# Patient Record
Sex: Female | Born: 1940
Health system: Southern US, Community
[De-identification: ages and names within clinical notes are randomized; demographics above are authoritative.]

## PROBLEM LIST (undated history)

## (undated) DIAGNOSIS — I10 Essential (primary) hypertension: Secondary | ICD-10-CM

## (undated) DIAGNOSIS — J45909 Unspecified asthma, uncomplicated: Secondary | ICD-10-CM

## (undated) HISTORY — PX: CHOLECYSTECTOMY: SHX55

---

## 2003-08-26 ENCOUNTER — Other Ambulatory Visit: Payer: Self-pay

## 2005-05-12 ENCOUNTER — Ambulatory Visit: Payer: Self-pay | Admitting: Internal Medicine

## 2006-08-12 ENCOUNTER — Ambulatory Visit: Payer: Self-pay | Admitting: Internal Medicine

## 2007-10-04 ENCOUNTER — Ambulatory Visit: Payer: Self-pay | Admitting: Internal Medicine

## 2009-10-11 ENCOUNTER — Ambulatory Visit: Payer: Self-pay | Admitting: Internal Medicine

## 2010-12-03 ENCOUNTER — Ambulatory Visit: Payer: Self-pay | Admitting: Internal Medicine

## 2013-01-19 ENCOUNTER — Emergency Department: Payer: Self-pay | Admitting: Internal Medicine

## 2018-01-17 ENCOUNTER — Encounter: Payer: Self-pay | Admitting: *Deleted

## 2018-01-20 ENCOUNTER — Ambulatory Visit: Payer: Medicare HMO | Admitting: Certified Registered"

## 2018-01-20 ENCOUNTER — Ambulatory Visit
Admission: RE | Admit: 2018-01-20 | Discharge: 2018-01-20 | Disposition: A | Discharge status: Home / Self Care | Payer: Medicare HMO | Source: Ambulatory Visit | Attending: Ophthalmology | Admitting: Ophthalmology

## 2018-01-20 ENCOUNTER — Encounter: Payer: Self-pay | Admitting: *Deleted

## 2018-01-20 ENCOUNTER — Other Ambulatory Visit: Payer: Self-pay

## 2018-01-20 ENCOUNTER — Encounter
Admission: RE | Disposition: A | Discharge status: Home / Self Care | Payer: Self-pay | Source: Ambulatory Visit | Attending: Ophthalmology

## 2018-01-20 DIAGNOSIS — Z79899 Other long term (current) drug therapy: Secondary | ICD-10-CM | POA: Diagnosis not present

## 2018-01-20 DIAGNOSIS — H2511 Age-related nuclear cataract, right eye: Secondary | ICD-10-CM | POA: Insufficient documentation

## 2018-01-20 DIAGNOSIS — J45909 Unspecified asthma, uncomplicated: Secondary | ICD-10-CM | POA: Insufficient documentation

## 2018-01-20 DIAGNOSIS — E1136 Type 2 diabetes mellitus with diabetic cataract: Secondary | ICD-10-CM | POA: Insufficient documentation

## 2018-01-20 DIAGNOSIS — I10 Essential (primary) hypertension: Secondary | ICD-10-CM | POA: Insufficient documentation

## 2018-01-20 DIAGNOSIS — Z9049 Acquired absence of other specified parts of digestive tract: Secondary | ICD-10-CM | POA: Insufficient documentation

## 2018-01-20 DIAGNOSIS — Z7984 Long term (current) use of oral hypoglycemic drugs: Secondary | ICD-10-CM | POA: Insufficient documentation

## 2018-01-20 HISTORY — PX: ANTERIOR VITRECTOMY: SHX1173

## 2018-01-20 HISTORY — DX: Essential (primary) hypertension: I10

## 2018-01-20 HISTORY — DX: Unspecified asthma, uncomplicated: J45.909

## 2018-01-20 HISTORY — PX: CATARACT EXTRACTION W/PHACO: SHX586

## 2018-01-20 SURGERY — PHACOEMULSIFICATION, CATARACT, WITH IOL INSERTION
Anesthesia: Monitor Anesthesia Care | Site: Eye | Laterality: Right

## 2018-01-20 MED ORDER — TRYPAN BLUE 0.06 % OP SOLN
OPHTHALMIC | Status: DC | PRN
  Administered 2018-01-20: 0.5 mL via INTRAOCULAR

## 2018-01-20 MED ORDER — TETRACAINE HCL 0.5 % OP SOLN
OPHTHALMIC | Status: DC | PRN
  Administered 2018-01-20: 2 [drp] via OPHTHALMIC

## 2018-01-20 MED ORDER — NA CHONDROIT SULF-NA HYALURON 40-17 MG/ML IO SOLN
INTRAOCULAR | Status: AC
  Filled 2018-01-20: qty 1

## 2018-01-20 MED ORDER — ACETAZOLAMIDE SODIUM 500 MG IJ SOLR
INTRAMUSCULAR | Status: AC
  Filled 2018-01-20: qty 500

## 2018-01-20 MED ORDER — STERILE WATER FOR INJECTION IJ SOLN
INTRAMUSCULAR | Status: AC
  Filled 2018-01-20: qty 10

## 2018-01-20 MED ORDER — POVIDONE-IODINE 5 % OP SOLN
OPHTHALMIC | Status: AC
  Filled 2018-01-20: qty 30

## 2018-01-20 MED ORDER — LIDOCAINE HCL (PF) 4 % IJ SOLN
INTRAMUSCULAR | Status: AC
  Filled 2018-01-20: qty 5

## 2018-01-20 MED ORDER — LIDOCAINE HCL (PF) 4 % IJ SOLN
INTRAOCULAR | Status: DC | PRN
  Administered 2018-01-20: 4 mL via OPHTHALMIC

## 2018-01-20 MED ORDER — ARMC OPHTHALMIC DILATING DROPS
1.0000 "application " | OPHTHALMIC | Status: AC
  Administered 2018-01-20 (×2): 1 via OPHTHALMIC

## 2018-01-20 MED ORDER — NA CHONDROIT SULF-NA HYALURON 40-30 MG/ML IO SOLN
INTRAOCULAR | Status: DC | PRN
  Administered 2018-01-20: 0.5 mL via INTRAOCULAR

## 2018-01-20 MED ORDER — FENTANYL CITRATE (PF) 100 MCG/2ML IJ SOLN
INTRAMUSCULAR | Status: DC | PRN
  Administered 2018-01-20: 50 ug via INTRAVENOUS

## 2018-01-20 MED ORDER — POVIDONE-IODINE 5 % OP SOLN
OPHTHALMIC | Status: DC | PRN
  Administered 2018-01-20: 1 via OPHTHALMIC

## 2018-01-20 MED ORDER — SODIUM CHLORIDE (PF) 0.9 % IJ SOLN
INTRAMUSCULAR | Status: AC
  Filled 2018-01-20: qty 10

## 2018-01-20 MED ORDER — EPINEPHRINE PF 1 MG/ML IJ SOLN
INTRAOCULAR | Status: DC | PRN
  Administered 2018-01-20: 100 mL via OPHTHALMIC

## 2018-01-20 MED ORDER — ARMC OPHTHALMIC DILATING DROPS
OPHTHALMIC | Status: AC
  Filled 2018-01-20: qty 0.5

## 2018-01-20 MED ORDER — CARBACHOL 0.01 % IO SOLN
INTRAOCULAR | Status: DC | PRN
  Administered 2018-01-20: 0.5 mL via INTRAOCULAR

## 2018-01-20 MED ORDER — ONDANSETRON HCL 4 MG/2ML IJ SOLN: 4.0000 mg | Freq: Once | INTRAMUSCULAR | Status: DC | PRN

## 2018-01-20 MED ORDER — MOXIFLOXACIN HCL 0.5 % OP SOLN
OPHTHALMIC | Status: AC
  Filled 2018-01-20: qty 3

## 2018-01-20 MED ORDER — TRIAMCINOLONE ACETONIDE 40 MG/ML IJ SUSP
INTRAMUSCULAR | Status: DC | PRN
  Administered 2018-01-20: 40 mg

## 2018-01-20 MED ORDER — EPINEPHRINE PF 1 MG/ML IJ SOLN
INTRAMUSCULAR | Status: AC
  Filled 2018-01-20: qty 2

## 2018-01-20 MED ORDER — TRIAMCINOLONE ACETONIDE 40 MG/ML IJ SUSP
INTRAMUSCULAR | Status: AC
  Filled 2018-01-20: qty 1

## 2018-01-20 MED ORDER — TETRACAINE HCL 0.5 % OP SOLN
1.0000 [drp] | OPHTHALMIC | Status: AC | PRN
  Administered 2018-01-20 (×3): 1 [drp] via OPHTHALMIC

## 2018-01-20 MED ORDER — ACETAZOLAMIDE SODIUM 500 MG IJ SOLR
INTRAMUSCULAR | Status: DC | PRN
  Administered 2018-01-20: 500 mg via INTRAVENOUS

## 2018-01-20 MED ORDER — MIDAZOLAM HCL 2 MG/2ML IJ SOLN
INTRAMUSCULAR | Status: DC | PRN
  Administered 2018-01-20: 1 mg via INTRAVENOUS

## 2018-01-20 MED ORDER — SODIUM CHLORIDE 0.9 % IV SOLN: INTRAVENOUS | Status: DC

## 2018-01-20 MED ORDER — MOXIFLOXACIN HCL 0.5 % OP SOLN
OPHTHALMIC | Status: DC | PRN
  Administered 2018-01-20: 0.2 mL via OPHTHALMIC

## 2018-01-20 MED ORDER — TETRACAINE HCL 0.5 % OP SOLN
OPHTHALMIC | Status: AC
  Administered 2018-01-20: 1 [drp] via OPHTHALMIC
  Filled 2018-01-20: qty 4

## 2018-01-20 MED ORDER — MOXIFLOXACIN HCL 0.5 % OP SOLN: 1.0000 [drp] | OPHTHALMIC | Status: DC | PRN

## 2018-01-20 MED ORDER — MIDAZOLAM HCL 2 MG/2ML IJ SOLN
INTRAMUSCULAR | Status: AC
  Filled 2018-01-20: qty 2

## 2018-01-20 MED ORDER — NA CHONDROIT SULF-NA HYALURON 40-17 MG/ML IO SOLN
INTRAOCULAR | Status: DC | PRN
  Administered 2018-01-20: 1 mL via INTRAOCULAR

## 2018-01-20 MED ORDER — TRYPAN BLUE 0.06 % OP SOLN
OPHTHALMIC | Status: AC
  Filled 2018-01-20: qty 0.5

## 2018-01-20 MED ORDER — FENTANYL CITRATE (PF) 100 MCG/2ML IJ SOLN
INTRAMUSCULAR | Status: AC
  Filled 2018-01-20: qty 2

## 2018-01-20 MED ORDER — FENTANYL CITRATE (PF) 100 MCG/2ML IJ SOLN: 25.0000 ug | INTRAMUSCULAR | Status: DC | PRN

## 2018-01-20 SURGICAL SUPPLY — 20 items
DISSECTOR HYDRO NUCLEUS 50X22 (MISCELLANEOUS) ×12 IMPLANT
GLOVE BIOGEL M 6.5 STRL (GLOVE) ×3 IMPLANT
GOWN STRL REUS W/ TWL LRG LVL3 (GOWN DISPOSABLE) ×1 IMPLANT
GOWN STRL REUS W/ TWL XL LVL3 (GOWN DISPOSABLE) ×1 IMPLANT
GOWN STRL REUS W/TWL LRG LVL3 (GOWN DISPOSABLE) ×2
GOWN STRL REUS W/TWL XL LVL3 (GOWN DISPOSABLE) ×2
KNIFE 45D UP 2.3 (MISCELLANEOUS) ×3 IMPLANT
LABEL CATARACT MEDS ST (LABEL) ×3 IMPLANT
LENS IOL ACRYSOF IQ 22.0 (Intraocular Lens) ×3 IMPLANT
PACK CATARACT (MISCELLANEOUS) ×3 IMPLANT
PACK CATARACT KING (MISCELLANEOUS) ×3 IMPLANT
PACK EYE AFTER SURG (MISCELLANEOUS) ×3 IMPLANT
PACK VIT ANT 23G (MISCELLANEOUS) ×3 IMPLANT
SOL BSS BAG (MISCELLANEOUS) ×3
SOLUTION BSS BAG (MISCELLANEOUS) ×1 IMPLANT
SPEAR PVA EYE SURG (MISCELLANEOUS) ×6 IMPLANT
SUT ETHILON 10 0 CS140 6 (SUTURE) ×3 IMPLANT
SYR 5ML LL (SYRINGE) ×6 IMPLANT
WATER STERILE IRR 250ML POUR (IV SOLUTION) ×3 IMPLANT
WIPE NON LINTING 3.25X3.25 (MISCELLANEOUS) ×3 IMPLANT

## 2018-01-20 NOTE — Discharge Instructions (Addendum)
Eye Surgery Discharge Instructions  Expect mild scratchy sensation or mild soreness. DO NOT RUB YOUR EYE!  The day of surgery:  Minimal physical activity, but bed rest is not required  No reading, computer work, or close hand work  No bending, lifting, or straining.  May watch TV  For 24 hours:  No driving, legal decisions, or alcoholic beverages  Safety precautions  Eat anything you prefer: It is better to start with liquids, then soup then solid foods.  Solar shield eyeglasses should be worn for comfort in the sunlight/patch while sleeping  Resume all regular medications including aspirin or Coumadin if these were discontinued prior to surgery. You may shower, bathe, shave, or wash your hair. Tylenol may be taken for mild discomfort. FOLLOW DR. HARROW'S EYE DROP INSTRUCTION SHEET AS REVIEWED.  Call your doctor if you experience significant pain, nausea, or vomiting, fever > 101 or other signs of infection. [phone] or [phone] Specific instructions:  Follow-up Information    Elliot CousinHarrow, Brian, MD Follow up.   Specialty:  Ophthalmology Why:  01/21/18 @ 8:15 am Contact information: [address] [place] [address] [phone]

## 2018-01-20 NOTE — Anesthesia Post-op Follow-up Note (Signed)
Anesthesia QCDR form completed.        

## 2018-01-20 NOTE — OR Nursing (Signed)
Discharge instructions discussed with pt and daughter. Both voice understanding. 

## 2018-01-20 NOTE — Op Note (Addendum)
  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   POSTOPERATIVE DIAGNOSIS:  PSC/Right nuclear sclerotic CATARACT   OPERATIVE PROCEDURE: Procedure(s): CATARACT EXTRACTION PHACO AND INTRAOCULAR LENS PLACEMENT (IOC) ANTERIOR VITRECTOMY   SURGEON:  Elliot CousinBrian Jaiyana Canale, MD.   ANESTHESIA:  Anesthesiologist: Yevette EdwardsAdams, James G, MD CRNA: Casey BurkittHoang, Thuy, CRNA  1.      Managed anesthesia care. 2.      0.71ml of Shugarcaine was instilled in the eye following the paracentesis.   COMPLICATIONS:  Zonular dehiscence; vitreous in anterior chamber; iris prolapse   TECHNIQUE:  Divide and conquer   DESCRIPTION OF PROCEDURE:  The patient was examined and consented in the preoperative holding area where the aforementioned topical anesthesia was applied to the right eye and then brought back to the Operating Room where the right eye was prepped and draped in the usual sterile ophthalmic fashion and a lid speculum was placed. A paracentesis was created with the side port blade and the anterior chamber was filled with viscoelastic, and trypan blue was used to stain the anterior capsule. A near clear corneal incision was performed with the steel keratome. A malyugin ring was inserted to expand the iris. A continuous curvilinear capsulorrhexis was performed with a cystotome followed by the capsulorrhexis forceps. Hydrodissection and hydrodelineation were carried out with BSS on a blunt cannula. The lens was removed and the much of the cortical material was removed with the irrigation-aspiration handpiece. The capsular bag was inflated with viscoelastic and the lens was placed in the capsular bag.   During irrigation and aspiration of the remaining cortical material, zonular dehiscence was noted from 3:00 - 6:00. Kenalog was used to stain vitreous in the anterior chamber. At this point, we performed an anterior vitrectomy on cut I/A mode after making an additional paracentesis. During this period, the iris  prolapsed and was repositioned. Two sutures were placed in the main wound. The anterior vitrectomy was performed until no remaining vitreous was visualized in the anterior chamber.   The wounds were hydrated. The anterior chamber was flushed with Miostat and the eye was inflated to physiologic pressure. 0.91ml of Vigamox was placed in the anterior chamber. The wounds were found to be water tight. The eye was dressed with Vigamox. The patient was given protective glasses to wear throughout the day and a shield with which to sleep tonight. The patient was also given drops with which to begin a drop regimen today and will follow-up with me in one day. Implant Name Type Inv. Item Serial No. Manufacturer Lot No. LRB No. Used  LENS IOL ACRYSOF IQ 22.0 - [id number] Intraocular Lens LENS IOL ACRYSOF IQ 22.0 [id number] 038 [employer]  Right 1   Procedure(s) with comments: CATARACT EXTRACTION PHACO AND INTRAOCULAR LENS PLACEMENT (IOC) (Right) - Lot# [id number] H US: 01:49.4 CDE: 21.64 ANTERIOR VITRECTOMY (Right)  Electronically signed:  Antolin 01/20/2018 11:01 AM

## 2018-01-20 NOTE — Anesthesia Procedure Notes (Signed)
Procedure Name: MAC Performed by: Ayauna Mcnay, CRNA Pre-anesthesia Checklist: Patient identified, Emergency Drugs available, Suction available, Patient being monitored and Timeout performed Oxygen Delivery Method: Nasal cannula       

## 2018-01-20 NOTE — Anesthesia Postprocedure Evaluation (Signed)
Anesthesia Post Note  Patient: Helen Herring  Procedure(s) Performed: CATARACT EXTRACTION PHACO AND INTRAOCULAR LENS PLACEMENT (IOC) (Right Eye) ANTERIOR VITRECTOMY (Right Eye)  Patient location during evaluation: PACU Anesthesia Type: MAC Level of consciousness: awake, awake and alert and oriented Pain management: pain level controlled Vital Signs Assessment: post-procedure vital signs reviewed and stable Respiratory status: spontaneous breathing, nonlabored ventilation and respiratory function stable Cardiovascular status: stable Anesthetic complications: no     Last Vitals:  Vitals:   01/20/18 0711 01/20/18 1054  BP: (!) 155/67 125/67  Pulse: 64 62  Resp: 16 13  Temp: (!) 36.1 C   SpO2: 100% 97%    Last Pain:  Vitals:   01/20/18 0711  TempSrc: Tympanic  PainSc: 0-No pain                 [employer]

## 2018-01-20 NOTE — H&P (Signed)
  I have reviewed the H&P and agree with its findings.  Reo Portela MD  

## 2018-01-20 NOTE — Transfer of Care (Signed)
Immediate Anesthesia Transfer of Care Note  Patient: Helen Herring  Procedure(s) Performed: CATARACT EXTRACTION PHACO AND INTRAOCULAR LENS PLACEMENT (IOC) (Right Eye) ANTERIOR VITRECTOMY (Right Eye)  Patient Location: PACU  Anesthesia Type:MAC  Level of Consciousness: awake, alert  and responds to stimulation  Airway & Oxygen Therapy: Patient Spontanous Breathing  Post-op Assessment: Report given to RN and Post -op Vital signs reviewed and stable  Post vital signs: Reviewed and stable  Last Vitals:  Vitals Value Taken Time  BP 125/67 01/20/2018 10:54 AM  Temp    Pulse 62 01/20/2018 10:54 AM  Resp 13 01/20/2018 10:54 AM  SpO2 97 % 01/20/2018 10:54 AM    Last Pain:  Vitals:   01/20/18 0711  TempSrc: Tympanic  PainSc: 0-No pain         Complications: No apparent anesthesia complications

## 2018-01-20 NOTE — Anesthesia Preprocedure Evaluation (Signed)
Anesthesia Evaluation  Patient identified by MRN, date of birth, ID band Patient awake    Reviewed: Allergy & Precautions, H&P , NPO status , Patient's Chart, lab work & pertinent test results, reviewed documented beta blocker date and time   Airway Mallampati: II  TM Distance: >3 FB Neck ROM: full    Dental no notable dental hx. (+) Teeth Intact   Pulmonary asthma ,    Pulmonary exam normal breath sounds clear to auscultation       Cardiovascular Exercise Tolerance: Good hypertension, On Medications negative cardio ROS   Rhythm:regular Rate:Normal     Neuro/Psych negative neurological ROS  negative psych ROS   GI/Hepatic negative GI ROS, Neg liver ROS,   Endo/Other  negative endocrine ROSdiabetes, Well Controlled, Type 2, Oral Hypoglycemic Agents  Renal/GU      Musculoskeletal   Abdominal   Peds  Hematology negative hematology ROS (+)   Anesthesia Other Findings   Reproductive/Obstetrics negative OB ROS                             Anesthesia Physical Anesthesia Plan  ASA: II  Anesthesia Plan: MAC   Post-op Pain Management:    Induction:   PONV Risk Score and Plan:   Airway Management Planned:   Additional Equipment:   Intra-op Plan:   Post-operative Plan:   Informed Consent: I have reviewed the patients History and Physical, chart, labs and discussed the procedure including the risks, benefits and alternatives for the proposed anesthesia with the patient or authorized representative who has indicated his/her understanding and acceptance.     Plan Discussed with: CRNA  Anesthesia Plan Comments:         Anesthesia Quick Evaluation

## 2018-02-15 NOTE — Addendum Note (Signed)
Addendum  created 02/15/18 1839 by Yevette EdwardsAdams, James G, MD   Attestation recorded in Intraprocedure, Intraprocedure Attestations filed

## 2019-01-18 DIAGNOSIS — N390 Urinary tract infection, site not specified: Secondary | ICD-10-CM | POA: Diagnosis not present

## 2019-01-18 DIAGNOSIS — A499 Bacterial infection, unspecified: Secondary | ICD-10-CM | POA: Diagnosis not present

## 2019-01-18 DIAGNOSIS — I10 Essential (primary) hypertension: Secondary | ICD-10-CM | POA: Diagnosis not present

## 2019-01-18 DIAGNOSIS — R918 Other nonspecific abnormal finding of lung field: Secondary | ICD-10-CM | POA: Diagnosis not present

## 2019-01-18 DIAGNOSIS — Z9119 Patient's noncompliance with other medical treatment and regimen: Secondary | ICD-10-CM | POA: Diagnosis not present

## 2019-01-18 DIAGNOSIS — G629 Polyneuropathy, unspecified: Secondary | ICD-10-CM | POA: Diagnosis not present

## 2019-05-24 ENCOUNTER — Other Ambulatory Visit: Payer: Self-pay

## 2019-05-24 NOTE — Patient Outreach (Signed)
[facility]) Care Management  05/24/2019  ELEINA JERGENS 1940/11/03 [id number]   Medication Adherence call to Mrs. Annabella Sarinana Hippa Identifiers Verify spoke with patient she is past due on Benazepril/Hctz 20/12.5 mg.patient explain she does not have any medication to call her pharmacy,when call the pharmacy they explain doctor has denied the prescription because she needs to see a primary care doctor.call patient back she said she will get in touch with a primary doctor and will schedule an appointment. Mrs. Boline is showing past due under [facility] Ins.   Lillia Abed CPhT Pharmacy Technician [facility] Management Direct Dial [phone]  Fax [phone] [email or web address]

## 2019-06-11 ENCOUNTER — Other Ambulatory Visit: Payer: Self-pay

## 2019-06-11 ENCOUNTER — Emergency Department: Payer: Medicare Other

## 2019-06-11 ENCOUNTER — Emergency Department
Admission: EM | Admit: 2019-06-11 | Discharge: 2019-06-11 | Disposition: A | Discharge status: Home / Self Care | Payer: Medicare Other | Attending: Student | Admitting: Student

## 2019-06-11 ENCOUNTER — Encounter: Payer: Self-pay | Admitting: Emergency Medicine

## 2019-06-11 DIAGNOSIS — Z79899 Other long term (current) drug therapy: Secondary | ICD-10-CM | POA: Diagnosis not present

## 2019-06-11 DIAGNOSIS — I10 Essential (primary) hypertension: Secondary | ICD-10-CM | POA: Insufficient documentation

## 2019-06-11 DIAGNOSIS — R449 Unspecified symptoms and signs involving general sensations and perceptions: Secondary | ICD-10-CM

## 2019-06-11 DIAGNOSIS — N179 Acute kidney failure, unspecified: Secondary | ICD-10-CM | POA: Insufficient documentation

## 2019-06-11 DIAGNOSIS — E871 Hypo-osmolality and hyponatremia: Secondary | ICD-10-CM | POA: Diagnosis not present

## 2019-06-11 DIAGNOSIS — Z9049 Acquired absence of other specified parts of digestive tract: Secondary | ICD-10-CM | POA: Diagnosis not present

## 2019-06-11 DIAGNOSIS — E860 Dehydration: Secondary | ICD-10-CM | POA: Insufficient documentation

## 2019-06-11 DIAGNOSIS — R42 Dizziness and giddiness: Secondary | ICD-10-CM | POA: Diagnosis not present

## 2019-06-11 DIAGNOSIS — J45909 Unspecified asthma, uncomplicated: Secondary | ICD-10-CM | POA: Diagnosis not present

## 2019-06-11 DIAGNOSIS — N390 Urinary tract infection, site not specified: Secondary | ICD-10-CM | POA: Insufficient documentation

## 2019-06-11 LAB — CBC WITH DIFFERENTIAL/PLATELET
Abs Immature Granulocytes: 0.02 10*3/uL (ref 0.00–0.07)
Basophils Absolute: 0.1 10*3/uL (ref 0.0–0.1)
Basophils Relative: 1 %
Eosinophils Absolute: 0.1 10*3/uL (ref 0.0–0.5)
Eosinophils Relative: 1 %
HCT: 35.6 % — ABNORMAL LOW (ref 36.0–46.0)
Hemoglobin: 12.2 g/dL (ref 12.0–15.0)
Immature Granulocytes: 0 %
Lymphocytes Relative: 25 %
Lymphs Abs: 1.6 10*3/uL (ref 0.7–4.0)
MCH: 31.6 pg (ref 26.0–34.0)
MCHC: 34.3 g/dL (ref 30.0–36.0)
MCV: 92.2 fL (ref 80.0–100.0)
Monocytes Absolute: 0.7 10*3/uL (ref 0.1–1.0)
Monocytes Relative: 11 %
Neutro Abs: 3.8 10*3/uL (ref 1.7–7.7)
Neutrophils Relative %: 62 %
Platelets: 299 10*3/uL (ref 150–400)
RBC: 3.86 MIL/uL — ABNORMAL LOW (ref 3.87–5.11)
RDW: 12.7 % (ref 11.5–15.5)
WBC: 6.2 10*3/uL (ref 4.0–10.5)
nRBC: 0 % (ref 0.0–0.2)

## 2019-06-11 LAB — COMPREHENSIVE METABOLIC PANEL
ALT: 8 U/L (ref 0–44)
AST: 18 U/L (ref 15–41)
Albumin: 4 g/dL (ref 3.5–5.0)
Alkaline Phosphatase: 59 U/L (ref 38–126)
Anion gap: 11 (ref 5–15)
BUN: 12 mg/dL (ref 8–23)
CO2: 27 mmol/L (ref 22–32)
Calcium: 9.1 mg/dL (ref 8.9–10.3)
Chloride: 91 mmol/L — ABNORMAL LOW (ref 98–111)
Creatinine, Ser: 1.4 mg/dL — ABNORMAL HIGH (ref 0.44–1.00)
GFR calc Af Amer: 42 mL/min — ABNORMAL LOW (ref >60)
GFR calc non Af Amer: 36 mL/min — ABNORMAL LOW (ref >60)
Glucose, Bld: 110 mg/dL — ABNORMAL HIGH (ref 70–99)
Potassium: 3.7 mmol/L (ref 3.5–5.1)
Sodium: 129 mmol/L — ABNORMAL LOW (ref 135–145)
Total Bilirubin: 0.9 mg/dL (ref 0.3–1.2)
Total Protein: 7.5 g/dL (ref 6.5–8.1)

## 2019-06-11 LAB — URINALYSIS, COMPLETE (UACMP) WITH MICROSCOPIC
Bilirubin Urine: NEGATIVE
Glucose, UA: NEGATIVE mg/dL
Ketones, ur: NEGATIVE mg/dL
Nitrite: NEGATIVE
Protein, ur: NEGATIVE mg/dL
Specific Gravity, Urine: 1.009 (ref 1.005–1.030)
pH: 5 (ref 5.0–8.0)

## 2019-06-11 MED ORDER — CEPHALEXIN 500 MG PO CAPS: 500.0000 mg | ORAL_CAPSULE | Freq: Two times a day (BID) | ORAL | 0 refills | Status: AC

## 2019-06-11 MED ORDER — CEPHALEXIN 500 MG PO CAPS
500.0000 mg | ORAL_CAPSULE | Freq: Once | ORAL | Status: AC
  Administered 2019-06-11: 500 mg via ORAL
  Filled 2019-06-11: qty 1

## 2019-06-11 MED ORDER — SODIUM CHLORIDE 0.9 % IV BOLUS
1000.0000 mL | Freq: Once | INTRAVENOUS | Status: AC
  Administered 2019-06-11: 13:00:00 1000 mL via INTRAVENOUS

## 2019-06-11 NOTE — Discharge Instructions (Signed)
Thank you for letting us take care of you in the emergency department today.  Your work-up today showed you are a bit dehydrated and also showed evidence of a urine infection.  Please be sure to stay well-hydrated over the next few days and take your antibiotics as prescribed.  Please follow-up with your primary care doctor in the next few weeks for recheck of your electrolytes and kidney function.  Please continue to take any regular, prescribed medications.   New medications we have prescribed:  Keflex, antibiotic for your urine infection  Please follow up with: Your primary care doctor to review your ER visit and follow up on your symptoms.    Please return to the ER for any new or worsening symptoms.

## 2019-06-11 NOTE — ED Triage Notes (Signed)
Pt to ED via POV, pt states that this morning she woke up feeling dizzy and nervous. Pt states that she did not feel like she usually feels. Pt states that she is not dizzy at this time. Pt denies any recent illness. Pt was ambulatory from front desk to treatment room without difficulty or distress. Pt is in NAD at this time.

## 2019-06-11 NOTE — ED Provider Notes (Signed)
[facility] Emergency Department Provider Note  ____________________________________________   First MD Initiated Contact with Patient 06/11/19 1055     (approximate)  I have reviewed the triage vital signs and the nursing notes.  History  Chief Complaint Dizziness    HPI CAPUCINE TRYON is a 79 y.o. female with a history of cataracts, HTN, asthma who presents to the emergency department for evaluation of feeling generally "off".  Patient states yesterday she experienced an episode where she saw a "bright, shiny, silver object" and heard "a noise".  She is unable to describe this any further, but states she was not particularly bothered by it, states it was "wonderful". This was brief and spontaneously resolved. No associated syncope or seizure activity. She felt fine immediately after this.  However, today upon awakening she felt slightly different than normal, and therefore presented to the ED for further evaluation. She denies any associated syncope, facial droop, speech difficulty, weakness. No vomiting, diarrhea, chest pain, urinary symptoms. No sick contacts. No hx of similar symptoms.   Past Medical Hx Past Medical History:  Diagnosis Date  . Asthma   . Hypertension     Problem List There are no problems to display for this patient.   Past Surgical Hx Past Surgical History:  Procedure Laterality Date  . ANTERIOR VITRECTOMY Right 01/20/2018   Procedure: ANTERIOR VITRECTOMY;  Surgeon: Elliot Cousin, MD;  Location: [facility] ORS;  Service: Ophthalmology;  Laterality: Right;  . CATARACT EXTRACTION W/PHACO Right 01/20/2018   Procedure: CATARACT EXTRACTION PHACO AND INTRAOCULAR LENS PLACEMENT (IOC);  Surgeon: Elliot Cousin, MD;  Location: [facility] ORS;  Service: Ophthalmology;  Laterality: Right;  Lot# [id number] H [place]: 01:49.4 CDE: 21.64  . CHOLECYSTECTOMY      Medications Prior to Admission medications   Medication Sig Start Date End Date Taking?  Authorizing Provider  benazepril-hydrochlorthiazide (LOTENSIN HCT) 20-12.5 MG tablet Take 1 tablet by mouth daily.    [provider]  gabapentin (NEURONTIN) 600 MG tablet Take 600 mg by mouth 3 (three) times daily.    [provider]  travoprost, benzalkonium, (TRAVATAN) 0.004 % ophthalmic solution Place 1 drop into both eyes at bedtime.    [provider]    Allergies Patient has no known allergies.  Family Hx No family history on file.  Social Hx Social History   Tobacco Use  . Smoking status: Never Smoker  . Smokeless tobacco: Never Used  Substance Use Topics  . Alcohol use: Never  . Drug use: Never     Review of Systems  Constitutional: Negative for fever. Negative for chills. + feeling off Eyes: Negative for visual changes. ENT: Negative for sore throat. Cardiovascular: Negative for chest pain. Respiratory: Negative for shortness of breath. Gastrointestinal: Negative for nausea. Negative for vomiting.  Genitourinary: Negative for dysuria. Musculoskeletal: Negative for leg swelling. Skin: Negative for rash. Neurological: Negative for headaches.   Physical Exam  Vital Signs: ED Triage Vitals  Enc Vitals Group     BP 06/11/19 1050 (!) 147/70     Pulse Rate 06/11/19 1050 76     Resp 06/11/19 1050 16     Temp 06/11/19 1050 98.6 F (37 C)     Temp Source 06/11/19 1050 Oral     SpO2 06/11/19 1050 95 %     Weight 06/11/19 1051 170 lb (77.1 kg)     Height --      Head Circumference --      Peak Flow --  Pain Score 06/11/19 1051 0     Pain Loc --      Pain Edu? --      Excl. in [place]? --     Constitutional: Alert and oriented. Well appearing. NAD.  Head: Normocephalic. Atraumatic. Eyes: Conjunctivae clear. Sclera anicteric. Pupils asymmetric 2/2 cataract surgery, baseline and unchanged.  Nose: No masses or lesions. No congestion or rhinorrhea. Mouth/Throat: Wearing mask.  Neck: No stridor. Trachea midline.  Cardiovascular:  Normal rate, regular rhythm. Extremities well perfused. Respiratory: Normal respiratory effort.  Lungs CTAB. Gastrointestinal: Soft. Non-distended. Non-tender.  Genitourinary: Deferred. Musculoskeletal: No lower extremity edema. No deformities. Neurologic:  Normal speech and language. No gross focal or lateralizing neurologic deficits are appreciated. Alert and oriented.  Face symmetric.  Tongue midline.  Cranial nerves II through XII intact. UE and LE strength 5/5 and symmetric. UE and LE SILT.  Skin: Skin is warm, dry and intact. No rash noted. Psychiatric: Mood and affect are appropriate for situation.  EKG  Personally reviewed and interpreted by myself.   Date: 06/11/19 Time: 11:39  Rate: 63 Rhythm: sinus Axis: normal Intervals: WNL No acute ischemic changes No STEMI    Radiology  Personally reviewed available imaging myself.   CT head  IMPRESSION:  No acute intracranial pathology. Small-vessel white matter disease.     Procedures  Procedure(s) performed (including critical care):  Procedures   Initial Impression / Assessment and Plan / MDM / ED Course  79 y.o. female who presents to the ED for feeling generally unwell today, and an abnormal "episode" yesterday, as described above  Ddx: aura, electrolyte abnormality, infection, intracranial pathology, infection, hypoglycemia  Will plan for labs, EKG, urine studies, CT imaging.  Clinical Course as of Jun 10 1409  Sun Jun 11, 2019  1249 Labs reveal very mild hyponatremia 129, hypochloremia 91, creatinine 1.4.  On review of care everywhere, appears her creatinine has been normal previously.  Perhaps an element of mild dehydration contributing.  Will provide IV fluids.  CT head is negative.   [SM]    Clinical Course User Index [SM] Lilia Pro., MD   UA suspicious for possible infection, will opt to treat. Patient continues to look well, normal neurological exam, no AMS.  As she remains stable, with only  mild AKI and hyponatremia, neurologically stable, and able to tolerate PO, feel she is appropriate for discharge at this time with further outpatient correction of her AKI and electrolytes. Advised adequate hydration and electrolytes as outpatient, and advised PCP for follow up and lab recheck. Patient and family voice understanding and are in agreement with plan. Given return precautions.  _______________________________   As part of my medical decision making I have reviewed available labs, radiology tests, reviewed old records/chart review, obtained additional history from family.    Final Clinical Impression(s) / ED Diagnosis  UTI Hyponatremia AKI Dehydration Feeling abnormal    Note:  This document was prepared using Dragon voice recognition software and may include unintentional dictation errors.   Lilia Pro., MD 06/11/19 2120

## 2019-06-14 DIAGNOSIS — Z1389 Encounter for screening for other disorder: Secondary | ICD-10-CM | POA: Diagnosis not present

## 2019-06-14 DIAGNOSIS — I10 Essential (primary) hypertension: Secondary | ICD-10-CM | POA: Diagnosis not present

## 2019-06-14 DIAGNOSIS — R413 Other amnesia: Secondary | ICD-10-CM | POA: Diagnosis not present

## 2019-06-14 DIAGNOSIS — Z0001 Encounter for general adult medical examination with abnormal findings: Secondary | ICD-10-CM | POA: Diagnosis not present

## 2019-06-14 DIAGNOSIS — M1990 Unspecified osteoarthritis, unspecified site: Secondary | ICD-10-CM | POA: Diagnosis not present

## 2019-06-19 DIAGNOSIS — H40003 Preglaucoma, unspecified, bilateral: Secondary | ICD-10-CM | POA: Diagnosis not present

## 2019-06-21 ENCOUNTER — Ambulatory Visit: Payer: Medicare Other

## 2019-07-12 ENCOUNTER — Observation Stay
Admission: EM | Admit: 2019-07-12 | Discharge: 2019-07-13 | Disposition: A | Discharge status: Home / Self Care | Payer: Medicare Other | Attending: Internal Medicine | Admitting: Internal Medicine

## 2019-07-12 ENCOUNTER — Other Ambulatory Visit: Payer: Self-pay

## 2019-07-12 ENCOUNTER — Encounter: Payer: Self-pay | Admitting: Emergency Medicine

## 2019-07-12 DIAGNOSIS — Z961 Presence of intraocular lens: Secondary | ICD-10-CM | POA: Insufficient documentation

## 2019-07-12 DIAGNOSIS — E860 Dehydration: Secondary | ICD-10-CM | POA: Insufficient documentation

## 2019-07-12 DIAGNOSIS — Z79899 Other long term (current) drug therapy: Secondary | ICD-10-CM | POA: Diagnosis not present

## 2019-07-12 DIAGNOSIS — I10 Essential (primary) hypertension: Secondary | ICD-10-CM

## 2019-07-12 DIAGNOSIS — Z20822 Contact with and (suspected) exposure to covid-19: Secondary | ICD-10-CM | POA: Diagnosis not present

## 2019-07-12 DIAGNOSIS — Z9841 Cataract extraction status, right eye: Secondary | ICD-10-CM | POA: Diagnosis not present

## 2019-07-12 DIAGNOSIS — Z9049 Acquired absence of other specified parts of digestive tract: Secondary | ICD-10-CM | POA: Insufficient documentation

## 2019-07-12 DIAGNOSIS — E871 Hypo-osmolality and hyponatremia: Principal | ICD-10-CM

## 2019-07-12 DIAGNOSIS — J45901 Unspecified asthma with (acute) exacerbation: Secondary | ICD-10-CM | POA: Diagnosis present

## 2019-07-12 LAB — CBC WITH DIFFERENTIAL/PLATELET
Abs Immature Granulocytes: 0.01 10*3/uL (ref 0.00–0.07)
Basophils Absolute: 0.1 10*3/uL (ref 0.0–0.1)
Basophils Relative: 1 %
Eosinophils Absolute: 0.1 10*3/uL (ref 0.0–0.5)
Eosinophils Relative: 1 %
HCT: 31.7 % — ABNORMAL LOW (ref 36.0–46.0)
Hemoglobin: 11 g/dL — ABNORMAL LOW (ref 12.0–15.0)
Immature Granulocytes: 0 %
Lymphocytes Relative: 28 %
Lymphs Abs: 1.7 10*3/uL (ref 0.7–4.0)
MCH: 31.7 pg (ref 26.0–34.0)
MCHC: 34.7 g/dL (ref 30.0–36.0)
MCV: 91.4 fL (ref 80.0–100.0)
Monocytes Absolute: 0.6 10*3/uL (ref 0.1–1.0)
Monocytes Relative: 10 %
Neutro Abs: 3.6 10*3/uL (ref 1.7–7.7)
Neutrophils Relative %: 60 %
Platelets: 269 10*3/uL (ref 150–400)
RBC: 3.47 MIL/uL — ABNORMAL LOW (ref 3.87–5.11)
RDW: 12.6 % (ref 11.5–15.5)
WBC: 6 10*3/uL (ref 4.0–10.5)
nRBC: 0 % (ref 0.0–0.2)

## 2019-07-12 LAB — COMPREHENSIVE METABOLIC PANEL
ALT: 12 U/L (ref 0–44)
AST: 17 U/L (ref 15–41)
Albumin: 4.2 g/dL (ref 3.5–5.0)
Alkaline Phosphatase: 61 U/L (ref 38–126)
Anion gap: 10 (ref 5–15)
BUN: 12 mg/dL (ref 8–23)
CO2: 26 mmol/L (ref 22–32)
Calcium: 9 mg/dL (ref 8.9–10.3)
Chloride: 83 mmol/L — ABNORMAL LOW (ref 98–111)
Creatinine, Ser: 1.33 mg/dL — ABNORMAL HIGH (ref 0.44–1.00)
GFR calc Af Amer: 45 mL/min — ABNORMAL LOW (ref >60)
GFR calc non Af Amer: 38 mL/min — ABNORMAL LOW (ref >60)
Glucose, Bld: 107 mg/dL — ABNORMAL HIGH (ref 70–99)
Potassium: 3.5 mmol/L (ref 3.5–5.1)
Sodium: 119 mmol/L — CL (ref 135–145)
Total Bilirubin: 0.7 mg/dL (ref 0.3–1.2)
Total Protein: 7.4 g/dL (ref 6.5–8.1)

## 2019-07-12 LAB — URINALYSIS, COMPLETE (UACMP) WITH MICROSCOPIC
Bilirubin Urine: NEGATIVE
Glucose, UA: NEGATIVE mg/dL
Hgb urine dipstick: NEGATIVE
Ketones, ur: NEGATIVE mg/dL
Leukocytes,Ua: NEGATIVE
Nitrite: NEGATIVE
Protein, ur: NEGATIVE mg/dL
Specific Gravity, Urine: 1.004 — ABNORMAL LOW (ref 1.005–1.030)
pH: 6 (ref 5.0–8.0)

## 2019-07-12 LAB — RESPIRATORY PANEL BY RT PCR (FLU A&B, COVID)
Influenza A by PCR: NEGATIVE
Influenza B by PCR: NEGATIVE
SARS Coronavirus 2 by RT PCR: NEGATIVE

## 2019-07-12 LAB — SODIUM, URINE, RANDOM: Sodium, Ur: 38 mmol/L

## 2019-07-12 LAB — OSMOLALITY, URINE: Osmolality, Ur: 170 mOsm/kg — ABNORMAL LOW (ref 300–900)

## 2019-07-12 LAB — OSMOLALITY: Osmolality: 263 mOsm/kg — ABNORMAL LOW (ref 275–295)

## 2019-07-12 MED ORDER — SODIUM CHLORIDE 0.9 % IV BOLUS
1000.0000 mL | Freq: Once | INTRAVENOUS | Status: AC
  Administered 2019-07-12: 17:00:00 1000 mL via INTRAVENOUS

## 2019-07-12 MED ORDER — BENAZEPRIL HCL 20 MG PO TABS
20.0000 mg | ORAL_TABLET | Freq: Every day | ORAL | Status: DC
  Administered 2019-07-12 – 2019-07-13 (×2): 20 mg via ORAL
  Filled 2019-07-12 (×4): qty 1

## 2019-07-12 MED ORDER — GABAPENTIN 600 MG PO TABS
600.0000 mg | ORAL_TABLET | Freq: Three times a day (TID) | ORAL | Status: DC
  Administered 2019-07-12 – 2019-07-13 (×3): 600 mg via ORAL
  Filled 2019-07-12 (×3): qty 1

## 2019-07-12 MED ORDER — ACETAMINOPHEN 325 MG PO TABS: 650.0000 mg | ORAL_TABLET | Freq: Four times a day (QID) | ORAL | Status: DC | PRN

## 2019-07-12 MED ORDER — SODIUM CHLORIDE 0.9 % IV SOLN: INTRAVENOUS | Status: DC

## 2019-07-12 MED ORDER — ACETAMINOPHEN 650 MG RE SUPP: 650.0000 mg | Freq: Four times a day (QID) | RECTAL | Status: DC | PRN

## 2019-07-12 MED ORDER — TRAZODONE HCL 50 MG PO TABS: 25.0000 mg | ORAL_TABLET | Freq: Every evening | ORAL | Status: DC | PRN

## 2019-07-12 MED ORDER — ENOXAPARIN SODIUM 40 MG/0.4ML ~~LOC~~ SOLN
40.0000 mg | SUBCUTANEOUS | Status: DC
  Administered 2019-07-12: 23:00:00 40 mg via SUBCUTANEOUS
  Filled 2019-07-12: qty 0.4

## 2019-07-12 NOTE — ED Triage Notes (Signed)
Pt states she has had decreased appetite the last few weeks and has not been eating much.

## 2019-07-12 NOTE — ED Triage Notes (Signed)
Pt presents to ED via POV, referred by [facility] for sodium of 123 drawn yesterday. Pt denies any other complaints at this time.

## 2019-07-12 NOTE — ED Notes (Signed)
FIRST NURSE: Pt sent by Dr. Letitia Libra, labs resulted today noted Na+ 122.

## 2019-07-12 NOTE — ED Notes (Signed)
Date and time results received: 07/12/19 2:44 PM   Test: Na++  Critical Value: 119  Name of Provider Notified: Mayford Knife

## 2019-07-12 NOTE — ED Provider Notes (Signed)
[facility] Emergency Department Provider Note ____________________________________________   First MD Initiated Contact with Patient 07/12/19 1646     (approximate)  I have reviewed the triage vital signs and the nursing notes.   HISTORY  Chief Complaint No chief complaint on file.    HPI Helen Herring is a 79 y.o. female with PMH as noted below who presents with hyponatremia, measured to 123 on labs done at her doctor's office yesterday.  The patient reports some generalized weakness over the last several weeks, but no other focal symptoms.  She does have decreased appetite and states that she has not been eating or drinking much.  She denies any medication changes recently.  Past Medical History:  Diagnosis Date  . Asthma   . Hypertension     There are no problems to display for this patient.   Past Surgical History:  Procedure Laterality Date  . ANTERIOR VITRECTOMY Right 01/20/2018   Procedure: ANTERIOR VITRECTOMY;  Surgeon: Marchia Meiers, MD;  Location: [facility] ORS;  Service: Ophthalmology;  Laterality: Right;  . CATARACT EXTRACTION W/PHACO Right 01/20/2018   Procedure: CATARACT EXTRACTION PHACO AND INTRAOCULAR LENS PLACEMENT (IOC);  Surgeon: Marchia Meiers, MD;  Location: [facility] ORS;  Service: Ophthalmology;  Laterality: Right;  Lot# [id number] H [place]: 01:49.4 CDE: 21.64  . CHOLECYSTECTOMY      Prior to Admission medications   Medication Sig Start Date End Date Taking? Authorizing Provider  benazepril-hydrochlorthiazide (LOTENSIN HCT) 20-12.5 MG tablet Take 1 tablet by mouth daily.    [provider]  gabapentin (NEURONTIN) 600 MG tablet Take 600 mg by mouth 3 (three) times daily.    [provider]  travoprost, benzalkonium, (TRAVATAN) 0.004 % ophthalmic solution Place 1 drop into both eyes at bedtime.    [provider]    Allergies Patient has no known allergies.  History reviewed. No pertinent family history.   Social History Social History   Tobacco Use  . Smoking status: Never Smoker  . Smokeless tobacco: Never Used  Substance Use Topics  . Alcohol use: Never  . Drug use: Never    Review of Systems  Constitutional: No fever/chills.  Positive generalized weakness. Eyes: No visual changes. ENT: No sore throat. Cardiovascular: Denies chest pain. Respiratory: Denies shortness of breath. Gastrointestinal: No nausea, no vomiting.  No diarrhea.  Genitourinary: Negative for dysuria.  Musculoskeletal: Negative for back pain. Skin: Negative for rash. Neurological: Negative for focal weakness or numbness.   ____________________________________________   PHYSICAL EXAM:  VITAL SIGNS: ED Triage Vitals  Enc Vitals Group     BP 07/12/19 1354 99/73     Pulse Rate 07/12/19 1354 65     Resp 07/12/19 1354 18     Temp 07/12/19 1354 98.2 F (36.8 C)     Temp Source 07/12/19 1354 Oral     SpO2 07/12/19 1354 97 %     Weight 07/12/19 1353 170 lb (77.1 kg)     Height 07/12/19 1353 5\' 4"  (1.626 m)     Head Circumference --      Peak Flow --      Pain Score 07/12/19 1354 0     Pain Loc --      Pain Edu? --      Excl. in [place]? --     Constitutional: Alert and oriented. Well appearing and in no acute distress. Eyes: Conjunctivae are normal.  Head: Atraumatic. Nose: No congestion/rhinnorhea. Mouth/Throat: Mucous membranes are moist.   Neck: Normal range of motion.  Cardiovascular: Normal rate, regular rhythm. Good peripheral circulation. Respiratory: Normal respiratory effort.  No retractions. Gastrointestinal: Soft and nontender. No distention.  Genitourinary: No flank tenderness. Musculoskeletal: No lower extremity edema.  Extremities warm and well perfused.  Neurologic:  Normal speech and language. No gross focal neurologic deficits are appreciated.  Skin:  Skin is warm and dry. No rash noted. Psychiatric: Mood and affect are normal. Speech and behavior are normal.   ____________________________________________   LABS (all labs ordered are listed, but only abnormal results are displayed)  Labs Reviewed  COMPREHENSIVE METABOLIC PANEL - Abnormal; Notable for the following components:      Result Value   Sodium 119 (*)    Chloride 83 (*)    Glucose, Bld 107 (*)    Creatinine, Ser 1.33 (*)    GFR calc non Af Amer 38 (*)    GFR calc Af Amer 45 (*)    All other components within normal limits  CBC WITH DIFFERENTIAL/PLATELET - Abnormal; Notable for the following components:   RBC 3.47 (*)    Hemoglobin 11.0 (*)    HCT 31.7 (*)    All other components within normal limits  RESPIRATORY PANEL BY RT PCR (FLU A&B, COVID)  SODIUM, URINE, RANDOM  URINALYSIS, COMPLETE (UACMP) WITH MICROSCOPIC  OSMOLALITY, URINE  OSMOLALITY   ____________________________________________  EKG   ____________________________________________  RADIOLOGY    ____________________________________________   PROCEDURES  Procedure(s) performed: No  Procedures  Critical Care performed: No ____________________________________________   INITIAL IMPRESSION / ASSESSMENT AND PLAN / ED COURSE  Pertinent labs & imaging results that were available during my care of the patient were reviewed by me and considered in my medical decision making (see chart for details).  79 year old female with PMH as noted above presents with hyponatremia.  I reviewed the past medical records in epic.  Patient had labs drawn yesterday showing a sodium of 123, however today the sodium is 119.  The patient reports generalized weakness and overall decreased appetite and p.o. intake.  I suspect that she is hypovolemic, and it appears that her sodium is chronically somewhat low.  Given this worsened sodium level, will admit for IV hydration and further work-up.  At this time there is no indication for hypertonic saline, and I have started NS.  We will obtain urine sodium and urine and serum  osmolality.  ----------------------------------------- 6:27 PM on 07/12/2019 -----------------------------------------  I discussed the case with the hospitalist for admission.  ____________________________________________   FINAL CLINICAL IMPRESSION(S) / ED DIAGNOSES  Final diagnoses:  Hyponatremia      NEW MEDICATIONS STARTED DURING THIS VISIT:  New Prescriptions   No medications on file     Note:  This document was prepared using Dragon voice recognition software and may include unintentional dictation errors.   Dionne Bucy, MD 07/12/19 [phone]

## 2019-07-12 NOTE — H&P (Signed)
History and Physical    Helen Herring BOF:[id number] DOB: 05/22/1940 DOA: 07/12/2019  PCP: Patient, No Pcp Per (Confirm with patient/family/NH records and if not entered, this has to be entered at [facility] point of entry) Patient coming from: home  I have personally briefly reviewed patient's old medical records in [facility]  Chief Complaint: weakness, hyponatremia  HPI: Helen Herring is a 79 y.o. female with medical history significant of mild asthma, HTN. She was seen [facility] 07/11/19 for generalized weakness. W/u with CTH negative, mild AKI w/ Cr 1.4, possible UTI. She was discharged home on abx and instructed in fluid intake. She returns to [facility] today for weakness, feeling ill. No specific c/o: no CP, no respiratory distress, no focal neurologic c/o.   ED Course: Hemodynamically stable. Lab reveals Na 119, Cr 1.33 (1.4 on 07/11/19) and otherwise negative. She is referred to [facility] for management of hyponatremia.  Review of Systems: As per HPI otherwise 10 point review of systems negative.    Past Medical History:  Diagnosis Date  . Asthma   . Hypertension     Past Surgical History:  Procedure Laterality Date  . ANTERIOR VITRECTOMY Right 01/20/2018   Procedure: ANTERIOR VITRECTOMY;  Surgeon: Elliot Cousin, MD;  Location: [facility] ORS;  Service: Ophthalmology;  Laterality: Right;  . CATARACT EXTRACTION W/PHACO Right 01/20/2018   Procedure: CATARACT EXTRACTION PHACO AND INTRAOCULAR LENS PLACEMENT (IOC);  Surgeon: Elliot Cousin, MD;  Location: [facility] ORS;  Service: Ophthalmology;  Laterality: Right;  Lot# [id number] H [place]: 01:49.4 CDE: 21.64  . CHOLECYSTECTOMY     Soc Hx - widowed after 12 years of marriage. Never remarried. Raised two daughters. Has 8 grandchildren. She worked in [employer] - multiple jobs. LIves alone but daughters are attentive.   reports that she has never smoked. She has never used smokeless tobacco. She reports that she does not drink alcohol or use drugs.  No Known  Allergies  History reviewed. No pertinent family history.    Prior to Admission medications   Medication Sig Start Date End Date Taking? Authorizing Provider  benazepril-hydrochlorthiazide (LOTENSIN HCT) 20-12.5 MG tablet Take 1 tablet by mouth daily.    [provider]  gabapentin (NEURONTIN) 600 MG tablet Take 600 mg by mouth 3 (three) times daily.    [provider]  travoprost, benzalkonium, (TRAVATAN) 0.004 % ophthalmic solution Place 1 drop into both eyes at bedtime.    [provider]    Physical Exam: Vitals:   07/12/19 1353 07/12/19 1354 07/12/19 1652 07/12/19 1810  BP:  99/73 (!) 171/76 (!) 152/93  Pulse:  65 69 65  Resp:  18 16 11   Temp:  98.2 F (36.8 C)    TempSrc:  Oral    SpO2:  97% 100% 100%  Weight: 77.1 kg     Height: 5\' 4"  (1.626 m)       Constitutional: NAD, calm, comfortable Vitals:   07/12/19 1353 07/12/19 1354 07/12/19 1652 07/12/19 1810  BP:  99/73 (!) 171/76 (!) 152/93  Pulse:  65 69 65  Resp:  18 16 11   Temp:  98.2 F (36.8 C)    TempSrc:  Oral    SpO2:  97% 100% 100%  Weight: 77.1 kg     Height: 5\' 4"  (1.626 m)      General -  WNWD woman in no distress Eyes: PERRL, arcus senilis noted, lids and conjunctivae normal ENMT: Mucous membranes are moist. Posterior pharynx clear of any exudate or lesions. Dentition -  has partial dentures in good condition.  Neck: normal, supple, no masses, no thyromegaly Respiratory: clear to auscultation bilaterally, no wheezing, no crackles. Normal respiratory effort. No accessory muscle use.  Cardiovascular: Regular rate and rhythm, no murmurs / rubs / gallops. No extremity edema. 2+ pedal pulses. No carotid bruits.  Abdomen: obese,  no tenderness, no masses palpated. No hepatosplenomegaly. Bowel sounds positive.  Musculoskeletal: no clubbing / cyanosis. No joint deformity upper and lower extremities. Good ROM, no contractures. Normal muscle tone.  Skin: no rashes, lesions, ulcers. No  induration Neurologic: CN 2-12 grossly intact. Sensation intact. Strength 5/5 in all 4.  Psychiatric: Normal judgment and insight. Alert and oriented x 3. Normal mood.     Labs on Admission: I have personally reviewed following labs and imaging studies  CBC: Recent Labs  Lab 07/12/19 1359  WBC 6.0  NEUTROABS 3.6  HGB 11.0*  HCT 31.7*  MCV 91.4  PLT 269   Basic Metabolic Panel: Recent Labs  Lab 07/12/19 1359  NA 119*  K 3.5  CL 83*  CO2 26  GLUCOSE 107*  BUN 12  CREATININE 1.33*  CALCIUM 9.0   GFR: Estimated Creatinine Clearance: 35.6 mL/min (A) (by C-G formula based on SCr of 1.33 mg/dL (H)). Liver Function Tests: Recent Labs  Lab 07/12/19 1359  AST 17  ALT 12  ALKPHOS 61  BILITOT 0.7  PROT 7.4  ALBUMIN 4.2   No results for input(s): LIPASE, AMYLASE in the last 168 hours. No results for input(s): AMMONIA in the last 168 hours. Coagulation Profile: No results for input(s): INR, PROTIME in the last 168 hours. Cardiac Enzymes: No results for input(s): CKTOTAL, CKMB, CKMBINDEX, TROPONINI in the last 168 hours. BNP (last 3 results) No results for input(s): PROBNP in the last 8760 hours. HbA1C: No results for input(s): HGBA1C in the last 72 hours. CBG: No results for input(s): GLUCAP in the last 168 hours. Lipid Profile: No results for input(s): CHOL, HDL, LDLCALC, TRIG, CHOLHDL, LDLDIRECT in the last 72 hours. Thyroid Function Tests: No results for input(s): TSH, T4TOTAL, FREET4, T3FREE, THYROIDAB in the last 72 hours. Anemia Panel: No results for input(s): VITAMINB12, FOLATE, FERRITIN, TIBC, IRON, RETICCTPCT in the last 72 hours. Urine analysis:    Component Value Date/Time   COLORURINE YELLOW (A) 06/11/2019 1208   APPEARANCEUR HAZY (A) 06/11/2019 1208   LABSPEC 1.009 06/11/2019 1208   PHURINE 5.0 06/11/2019 1208   GLUCOSEU NEGATIVE 06/11/2019 1208   HGBUR SMALL (A) 06/11/2019 1208   BILIRUBINUR NEGATIVE 06/11/2019 1208   KETONESUR NEGATIVE  06/11/2019 1208   PROTEINUR NEGATIVE 06/11/2019 1208   NITRITE NEGATIVE 06/11/2019 1208   LEUKOCYTESUR SMALL (A) 06/11/2019 1208    Radiological Exams on Admission: No results found.  EKG: Independently reviewed. Sinus rhythm, low voltage noted.   Assessment/Plan Active Problems:   Hyponatremia   HTN (hypertension)   Asthma, chronic, unspecified asthma severity, with acute exacerbation  (please populate well all problems here in Problem List. (For example, if patient is on BP meds at home and you resume or decide to hold them, it is a problem that needs to be her. Same for CAD, COPD, HLD and so on)   1. Hyponatremia - suspect combination of poor fluid intake and diuretic use. Asymptomatic PLan Med-surg admit  IVF - received NS bolus in ED. Will continue NS at 75 cc/hr for 8 hours  Med change: lotensin/HCT to Lotensin alone  2. HTN - stable. Will continue home meds with the change noted  in #1  3. Code status - discussed with patient - full code  4. Dispositon - anticipate return to home  DVT prophylaxis: lovenox Code Status: full code  Family Communication: spoke with Saunders Glance, daughter. Answered all questions. She is concerned about memory loss. Advised f/u with PCP  Disposition Plan: home 24-48 hours Consults called: none (with names) Admission status: obs    Adella Hare MD [facility] Pager [phone]  If 7PM-7AM, please contact night-coverage [email or web address] Password [facility]  07/12/2019, 6:37 PM

## 2019-07-13 DIAGNOSIS — E871 Hypo-osmolality and hyponatremia: Secondary | ICD-10-CM | POA: Diagnosis not present

## 2019-07-13 DIAGNOSIS — I10 Essential (primary) hypertension: Secondary | ICD-10-CM | POA: Diagnosis not present

## 2019-07-13 LAB — BASIC METABOLIC PANEL
Anion gap: 2 — ABNORMAL LOW (ref 5–15)
BUN: 8 mg/dL (ref 8–23)
CO2: 26 mmol/L (ref 22–32)
Calcium: 8.5 mg/dL — ABNORMAL LOW (ref 8.9–10.3)
Chloride: 95 mmol/L — ABNORMAL LOW (ref 98–111)
Creatinine, Ser: 1.07 mg/dL — ABNORMAL HIGH (ref 0.44–1.00)
GFR calc Af Amer: 58 mL/min — ABNORMAL LOW (ref >60)
GFR calc non Af Amer: 50 mL/min — ABNORMAL LOW (ref >60)
Glucose, Bld: 96 mg/dL (ref 70–99)
Potassium: 3.8 mmol/L (ref 3.5–5.1)
Sodium: 123 mmol/L — ABNORMAL LOW (ref 135–145)

## 2019-07-13 LAB — SODIUM: Sodium: 127 mmol/L — ABNORMAL LOW (ref 135–145)

## 2019-07-13 MED ORDER — BENAZEPRIL HCL 20 MG PO TABS: 20.0000 mg | ORAL_TABLET | Freq: Every day | ORAL | 0 refills | Status: DC

## 2019-07-13 NOTE — Discharge Summary (Signed)
Physician Discharge Summary  Patient ID: Helen Herring MRN: [id number] DOB/AGE: 1940-12-08 79 y.o.  Admit date: 07/12/2019 Discharge date: 07/13/2019  Admission Diagnoses: Hyponatremia Discharge Diagnoses:  Active Problems:   Hyponatremia   HTN (hypertension)   Asthma, chronic, unspecified asthma severity, with acute exacerbation   Discharged Condition: good  Hospital Course:  HPI: Helen Herring is a 79 y.o. female with medical history significant of mild asthma, HTN. She was seen [facility] 07/11/19 for generalized weakness. W/u with CTH negative, mild AKI w/ Cr 1.4, possible UTI. She was discharged home on abx and instructed in fluid intake. She returns to [facility] today for weakness, feeling ill. No specific c/o: no CP, no respiratory distress, no focal neurologic c/o.   ED Course: Hemodynamically stable. Lab reveals Na 119, Cr 1.33 (1.4 on 07/11/19) and otherwise negative. She is referred to [facility] for management of hyponatremia.  Patient was given IV fluids for dehydration.  Her sodium level gradually went up to 127 today.  I reviewed her previous lab results from outside hospital, her sodium level was 123 on 07/11/2019.  Sodium level went up appropriately.  Currently patient feels better.  She is medically stable to be discharged.  I have discussed with the patient as well as her daughter in patient room, she is advised as a following.  #1.  Fluid restriction less than 1500 mL/day. #2.  No restrictions are sought. 3.  Follow-up with her family doctor in 1 week. 4.  Blood pressure medicine has been replaced, HCTZ was removed.  Consults: None  Significant Diagnostic Studies: Labs showed as above  Treatments: IV fluids  Discharge Exam: Blood pressure (!) 122/52, pulse 82, temperature 98.1 F (36.7 C), temperature source Oral, resp. rate 16, height 5\' 1"  (1.549 m), weight 88.5 kg, SpO2 99 %. General appearance: alert and cooperative Resp: clear to auscultation bilaterally Cardio:  regular rate and rhythm, S1, S2 normal, no murmur, click, rub or gallop GI: soft, non-tender; bowel sounds normal; no masses,  no organomegaly Extremities: extremities normal, atraumatic, no cyanosis or edema  Disposition: Discharge disposition: 01-Home or Self Care       Discharge Instructions    Diet - low sodium heart healthy   Complete by: As directed    Fluid restriction 153ml/day No restriction on salt   Increase activity slowly   Complete by: As directed      Allergies as of 07/13/2019   No Known Allergies     Medication List    STOP taking these medications   benazepril-hydrochlorthiazide 20-12.5 MG tablet Commonly known as: LOTENSIN HCT     TAKE these medications   benazepril 20 MG tablet Commonly known as: LOTENSIN Take 1 tablet (20 mg total) by mouth daily. Start taking on: Jul 14, 2019   dorzolamide-timolol 22.3-6.8 MG/ML ophthalmic solution Commonly known as: COSOPT Place 1 drop into both eyes in the morning and at bedtime.   gabapentin 600 MG tablet Commonly known as: NEURONTIN Take 600 mg by mouth 3 (three) times daily.   travoprost (benzalkonium) 0.004 % ophthalmic solution Commonly known as: TRAVATAN Place 1 drop into both eyes at bedtime.        SignedMay 9, 2021 07/13/2019, 2:07 PM

## 2019-07-13 NOTE — Care Management Obs Status (Signed)
MEDICARE OBSERVATION STATUS NOTIFICATION   Patient Details  Name: Helen Herring MRN: [id number] Date of Birth: January 21, 1941   Medicare Observation Status Notification Given:  No(admitted obs less than 24 hours)    Chapman Fitch, RN 07/13/2019, 2:13 PM

## 2019-07-13 NOTE — Progress Notes (Signed)
Assumed of care of patient this evening. Patient alert oriented and pleasant without c/o pain at this time. Patient states she has been weaker than usual. However can still ambulate within the room with a SBA. Patient hooked to fluids and would like to rest. No acute findings noted at this time. Safety checks completed and call light placed within reach.

## 2019-08-14 ENCOUNTER — Other Ambulatory Visit: Payer: Self-pay | Admitting: Student

## 2019-08-14 ENCOUNTER — Other Ambulatory Visit
Admission: RE | Admit: 2019-08-14 | Discharge: 2019-08-14 | Disposition: A | Discharge status: Home / Self Care | Payer: Medicare Other | Source: Ambulatory Visit | Attending: Student | Admitting: Student

## 2019-08-14 ENCOUNTER — Ambulatory Visit: Payer: Medicare Other

## 2019-08-14 DIAGNOSIS — M25473 Effusion, unspecified ankle: Secondary | ICD-10-CM

## 2019-08-14 LAB — FIBRIN DERIVATIVES D-DIMER (ARMC ONLY): Fibrin derivatives D-dimer (ARMC): 4141.45 ng/mL (FEU) — ABNORMAL HIGH (ref 0.00–499.00)

## 2019-08-14 LAB — BRAIN NATRIURETIC PEPTIDE: B Natriuretic Peptide: 46.1 pg/mL (ref 0.0–100.0)

## 2019-08-15 ENCOUNTER — Other Ambulatory Visit: Payer: Self-pay

## 2019-08-15 ENCOUNTER — Ambulatory Visit
Admission: RE | Admit: 2019-08-15 | Discharge: 2019-08-15 | Disposition: A | Discharge status: Home / Self Care | Payer: Medicare Other | Source: Ambulatory Visit | Attending: Student | Admitting: Student

## 2019-08-15 DIAGNOSIS — M25473 Effusion, unspecified ankle: Secondary | ICD-10-CM | POA: Diagnosis present

## 2020-01-03 ENCOUNTER — Encounter: Payer: Self-pay | Admitting: Emergency Medicine

## 2020-01-03 ENCOUNTER — Inpatient Hospital Stay: Payer: Medicare Other

## 2020-01-03 ENCOUNTER — Other Ambulatory Visit: Payer: Self-pay

## 2020-01-03 ENCOUNTER — Emergency Department: Payer: Medicare Other

## 2020-01-03 ENCOUNTER — Inpatient Hospital Stay
Admission: EM | Admit: 2020-01-03 | Discharge: 2020-01-07 | DRG: 644 | Disposition: A | Discharge status: Home / Self Care | Payer: Medicare Other | Attending: Hospitalist | Admitting: Hospitalist

## 2020-01-03 DIAGNOSIS — Z87440 Personal history of urinary (tract) infections: Secondary | ICD-10-CM | POA: Diagnosis not present

## 2020-01-03 DIAGNOSIS — E222 Syndrome of inappropriate secretion of antidiuretic hormone: Secondary | ICD-10-CM | POA: Diagnosis not present

## 2020-01-03 DIAGNOSIS — N1831 Chronic kidney disease, stage 3a: Secondary | ICD-10-CM | POA: Diagnosis present

## 2020-01-03 DIAGNOSIS — E860 Dehydration: Secondary | ICD-10-CM | POA: Diagnosis present

## 2020-01-03 DIAGNOSIS — R531 Weakness: Secondary | ICD-10-CM | POA: Diagnosis not present

## 2020-01-03 DIAGNOSIS — Z79899 Other long term (current) drug therapy: Secondary | ICD-10-CM | POA: Diagnosis not present

## 2020-01-03 DIAGNOSIS — I129 Hypertensive chronic kidney disease with stage 1 through stage 4 chronic kidney disease, or unspecified chronic kidney disease: Secondary | ICD-10-CM | POA: Diagnosis present

## 2020-01-03 DIAGNOSIS — T426X5A Adverse effect of other antiepileptic and sedative-hypnotic drugs, initial encounter: Secondary | ICD-10-CM | POA: Diagnosis present

## 2020-01-03 DIAGNOSIS — G629 Polyneuropathy, unspecified: Secondary | ICD-10-CM | POA: Diagnosis present

## 2020-01-03 DIAGNOSIS — N179 Acute kidney failure, unspecified: Principal | ICD-10-CM

## 2020-01-03 DIAGNOSIS — G44209 Tension-type headache, unspecified, not intractable: Secondary | ICD-10-CM | POA: Diagnosis present

## 2020-01-03 DIAGNOSIS — Z20822 Contact with and (suspected) exposure to covid-19: Secondary | ICD-10-CM | POA: Diagnosis present

## 2020-01-03 DIAGNOSIS — E871 Hypo-osmolality and hyponatremia: Secondary | ICD-10-CM

## 2020-01-03 DIAGNOSIS — J4530 Mild persistent asthma, uncomplicated: Secondary | ICD-10-CM | POA: Diagnosis present

## 2020-01-03 DIAGNOSIS — F0390 Unspecified dementia without behavioral disturbance: Secondary | ICD-10-CM | POA: Diagnosis present

## 2020-01-03 DIAGNOSIS — Z23 Encounter for immunization: Secondary | ICD-10-CM

## 2020-01-03 DIAGNOSIS — I10 Essential (primary) hypertension: Secondary | ICD-10-CM | POA: Diagnosis not present

## 2020-01-03 DIAGNOSIS — M1990 Unspecified osteoarthritis, unspecified site: Secondary | ICD-10-CM | POA: Diagnosis present

## 2020-01-03 DIAGNOSIS — E861 Hypovolemia: Secondary | ICD-10-CM | POA: Diagnosis present

## 2020-01-03 DIAGNOSIS — R519 Headache, unspecified: Secondary | ICD-10-CM

## 2020-01-03 LAB — COMPREHENSIVE METABOLIC PANEL
ALT: 11 U/L (ref 0–44)
AST: 22 U/L (ref 15–41)
Albumin: 4.4 g/dL (ref 3.5–5.0)
Alkaline Phosphatase: 85 U/L (ref 38–126)
Anion gap: 13 (ref 5–15)
BUN: 19 mg/dL (ref 8–23)
CO2: 22 mmol/L (ref 22–32)
Calcium: 9 mg/dL (ref 8.9–10.3)
Chloride: 88 mmol/L — ABNORMAL LOW (ref 98–111)
Creatinine, Ser: 1.59 mg/dL — ABNORMAL HIGH (ref 0.44–1.00)
GFR, Estimated: 33 mL/min — ABNORMAL LOW (ref >60)
Glucose, Bld: 100 mg/dL — ABNORMAL HIGH (ref 70–99)
Potassium: 4.6 mmol/L (ref 3.5–5.1)
Sodium: 123 mmol/L — ABNORMAL LOW (ref 135–145)
Total Bilirubin: 1 mg/dL (ref 0.3–1.2)
Total Protein: 8.2 g/dL — ABNORMAL HIGH (ref 6.5–8.1)

## 2020-01-03 LAB — CREATININE, URINE, RANDOM: Creatinine, Urine: 165 mg/dL

## 2020-01-03 LAB — TROPONIN I (HIGH SENSITIVITY)
Troponin I (High Sensitivity): 5 ng/L (ref <18)
Troponin I (High Sensitivity): 5 ng/L (ref <18)

## 2020-01-03 LAB — CBC WITH DIFFERENTIAL/PLATELET
Abs Immature Granulocytes: 0.03 10*3/uL (ref 0.00–0.07)
Basophils Absolute: 0 10*3/uL (ref 0.0–0.1)
Basophils Relative: 1 %
Eosinophils Absolute: 0.1 10*3/uL (ref 0.0–0.5)
Eosinophils Relative: 1 %
HCT: 37.7 % (ref 36.0–46.0)
Hemoglobin: 13 g/dL (ref 12.0–15.0)
Immature Granulocytes: 0 %
Lymphocytes Relative: 19 %
Lymphs Abs: 1.5 10*3/uL (ref 0.7–4.0)
MCH: 31.9 pg (ref 26.0–34.0)
MCHC: 34.5 g/dL (ref 30.0–36.0)
MCV: 92.4 fL (ref 80.0–100.0)
Monocytes Absolute: 0.8 10*3/uL (ref 0.1–1.0)
Monocytes Relative: 10 %
Neutro Abs: 5.7 10*3/uL (ref 1.7–7.7)
Neutrophils Relative %: 69 %
Platelets: 325 10*3/uL (ref 150–400)
RBC: 4.08 MIL/uL (ref 3.87–5.11)
RDW: 13.1 % (ref 11.5–15.5)
WBC: 8 10*3/uL (ref 4.0–10.5)
nRBC: 0 % (ref 0.0–0.2)

## 2020-01-03 LAB — SEDIMENTATION RATE: Sed Rate: 28 mm/hr (ref 0–30)

## 2020-01-03 LAB — SODIUM, URINE, RANDOM: Sodium, Ur: 87 mmol/L

## 2020-01-03 LAB — RESPIRATORY PANEL BY RT PCR (FLU A&B, COVID)
Influenza A by PCR: NEGATIVE
Influenza B by PCR: NEGATIVE
SARS Coronavirus 2 by RT PCR: NEGATIVE

## 2020-01-03 LAB — URINALYSIS, COMPLETE (UACMP) WITH MICROSCOPIC
Bacteria, UA: NONE SEEN
Bilirubin Urine: NEGATIVE
Glucose, UA: NEGATIVE mg/dL
Hgb urine dipstick: NEGATIVE
Ketones, ur: 5 mg/dL — AB
Leukocytes,Ua: NEGATIVE
Nitrite: NEGATIVE
Protein, ur: NEGATIVE mg/dL
Specific Gravity, Urine: 1.012 (ref 1.005–1.030)
pH: 7 (ref 5.0–8.0)

## 2020-01-03 LAB — OSMOLALITY: Osmolality: 261 mOsm/kg — ABNORMAL LOW (ref 275–295)

## 2020-01-03 LAB — TSH: TSH: 2.114 u[IU]/mL (ref 0.350–4.500)

## 2020-01-03 LAB — OSMOLALITY, URINE: Osmolality, Ur: 403 mOsm/kg (ref 300–900)

## 2020-01-03 MED ORDER — DORZOLAMIDE HCL-TIMOLOL MAL 2-0.5 % OP SOLN: 1.0000 [drp] | Freq: Two times a day (BID) | OPHTHALMIC | Status: DC

## 2020-01-03 MED ORDER — ONDANSETRON HCL 4 MG/2ML IJ SOLN: 4.0000 mg | Freq: Four times a day (QID) | INTRAMUSCULAR | Status: DC | PRN

## 2020-01-03 MED ORDER — ACETAMINOPHEN 325 MG PO TABS: 650.0000 mg | ORAL_TABLET | Freq: Four times a day (QID) | ORAL | Status: DC | PRN

## 2020-01-03 MED ORDER — LACTATED RINGERS IV BOLUS
500.0000 mL | Freq: Once | INTRAVENOUS | Status: AC
  Administered 2020-01-03: 500 mL via INTRAVENOUS

## 2020-01-03 MED ORDER — ACETAMINOPHEN 500 MG PO TABS
1000.0000 mg | ORAL_TABLET | Freq: Once | ORAL | Status: AC
  Administered 2020-01-03: 1000 mg via ORAL
  Filled 2020-01-03: qty 2

## 2020-01-03 MED ORDER — MELATONIN 5 MG PO TABS
5.0000 mg | ORAL_TABLET | Freq: Every evening | ORAL | Status: DC | PRN
  Filled 2020-01-03: qty 1

## 2020-01-03 MED ORDER — LATANOPROST 0.005 % OP SOLN
1.0000 [drp] | Freq: Every day | OPHTHALMIC | Status: DC
  Administered 2020-01-03 – 2020-01-05 (×3): 1 [drp] via OPHTHALMIC
  Filled 2020-01-03 (×2): qty 2.5

## 2020-01-03 MED ORDER — TIMOLOL MALEATE 0.5 % OP SOLN
1.0000 [drp] | Freq: Two times a day (BID) | OPHTHALMIC | Status: DC
  Administered 2020-01-03 – 2020-01-07 (×7): 1 [drp] via OPHTHALMIC
  Filled 2020-01-03 (×2): qty 5

## 2020-01-03 MED ORDER — TRAVOPROST 0.004 % OP SOLN: 1.0000 [drp] | Freq: Every day | OPHTHALMIC | Status: DC

## 2020-01-03 MED ORDER — DORZOLAMIDE HCL 2 % OP SOLN
1.0000 [drp] | Freq: Two times a day (BID) | OPHTHALMIC | Status: DC
  Administered 2020-01-03 – 2020-01-07 (×7): 1 [drp] via OPHTHALMIC
  Filled 2020-01-03 (×2): qty 10

## 2020-01-03 MED ORDER — ACETAMINOPHEN 650 MG RE SUPP: 650.0000 mg | Freq: Four times a day (QID) | RECTAL | Status: DC | PRN

## 2020-01-03 MED ORDER — LACTATED RINGERS IV SOLN: INTRAVENOUS | Status: DC

## 2020-01-03 MED ORDER — ALBUTEROL SULFATE HFA 108 (90 BASE) MCG/ACT IN AERS
1.0000 | INHALATION_SPRAY | RESPIRATORY_TRACT | Status: DC | PRN
  Filled 2020-01-03: qty 6.7

## 2020-01-03 NOTE — ED Triage Notes (Addendum)
Pt comes into the ED via [facility] from home c/o headache.  Pt denies any light or noise sensitivity.  Pt currently neurologically intact at this time.  Pt does have starting signs of dementia per daughter.  Pt in [facility]Ad at this time.  Pt states the headache has been ongoing since yesterday. Per EMS patient has some slurred speech at times, but this is baseline for months per the daughter.

## 2020-01-03 NOTE — ED Provider Notes (Signed)
[facility] Emergency Department Provider Note  ____________________________________________   First MD Initiated Contact with Patient 01/03/20 1240     (approximate)  I have reviewed the triage vital signs and the nursing notes.   HISTORY  Chief Complaint Headache   HPI Helen Herring is a 79 y.o. femalewith medical history significant ofdementia, mild asthma, HTN and dehydration and UTI in May of this year presents accompanied by her daughter after her daughter states patient was complaining of a headache yesterday and she states he thinks her mother is more fatigued than usual over the last 2 days.  Patient states she is not currently in any pain and denies any headache but states her head just does not "feel quite right".  She denies any recent falls or injuries and is not anticoagulated.  She has no other acute symptoms and denies any fevers, chills, vision changes, vertigo, earache, sore throat, cough, chest pain, shortness of breath, dental pain, vomiting, diarrhea, dysuria, rash, focal extremity weakness numbness or tingling, or other acute complaints.  No prior similar episodes.  No alleviating aggravating factors.  Specifically patient denies any photophobia or phonophobia.         Past Medical History:  Diagnosis Date  . Asthma   . Hypertension     Patient Active Problem List   Diagnosis Date Noted  . Hyponatremia 07/12/2019  . HTN (hypertension) 07/12/2019  . Asthma, chronic, unspecified asthma severity, with acute exacerbation 07/12/2019    Past Surgical History:  Procedure Laterality Date  . ANTERIOR VITRECTOMY Right 01/20/2018   Procedure: ANTERIOR VITRECTOMY;  Surgeon: Marchia Meiers, MD;  Location: [facility] ORS;  Service: Ophthalmology;  Laterality: Right;  . CATARACT EXTRACTION W/PHACO Right 01/20/2018   Procedure: CATARACT EXTRACTION PHACO AND INTRAOCULAR LENS PLACEMENT (IOC);  Surgeon: Marchia Meiers, MD;  Location: [facility] ORS;   Service: Ophthalmology;  Laterality: Right;  Lot# [id number] H [place]: 01:49.4 CDE: 21.64  . CHOLECYSTECTOMY      Prior to Admission medications   Medication Sig Start Date End Date Taking? Authorizing Provider  benazepril (LOTENSIN) 20 MG tablet Take 1 tablet (20 mg total) by mouth daily. 07/14/19   Sharen Hones, MD  dorzolamide-timolol (COSOPT) 22.3-6.8 MG/ML ophthalmic solution Place 1 drop into both eyes in the morning and at bedtime. 06/19/19   [provider]  gabapentin (NEURONTIN) 600 MG tablet Take 600 mg by mouth 3 (three) times daily.    [provider]  travoprost, benzalkonium, (TRAVATAN) 0.004 % ophthalmic solution Place 1 drop into both eyes at bedtime.    [provider]    Allergies Patient has no known allergies.  History reviewed. No pertinent family history.  Social History Social History   Tobacco Use  . Smoking status: Never Smoker  . Smokeless tobacco: Never Used  Vaping Use  . Vaping Use: Never used  Substance Use Topics  . Alcohol use: Never  . Drug use: Never    Review of Systems  Review of Systems  Constitutional: Positive for malaise/fatigue ( per daughter). Negative for chills and fever.  HENT: Negative for sore throat.   Eyes: Negative for pain.  Respiratory: Negative for cough and stridor.   Cardiovascular: Negative for chest pain.  Gastrointestinal: Negative for vomiting.  Genitourinary: Negative for dysuria.  Musculoskeletal: Negative for myalgias.  Skin: Negative for rash.  Neurological: Positive for headaches ( per daughter yesterday although patient denies today). Negative for seizures and loss of consciousness.  Psychiatric/Behavioral: Negative for suicidal ideas.  All other  systems reviewed and are negative.     ____________________________________________   PHYSICAL EXAM:  VITAL SIGNS: ED Triage Vitals  Enc Vitals Group     BP 01/03/20 1021 (!) 155/87     Pulse Rate 01/03/20 1021 80     Resp 01/03/20  1021 17     Temp 01/03/20 1021 98.4 F (36.9 C)     Temp Source 01/03/20 1021 Oral     SpO2 01/03/20 1021 100 %     Weight --      Height --      Head Circumference --      Peak Flow --      Pain Score 01/03/20 1024 5     Pain Loc --      Pain Edu? --      Excl. in [place]? --    Vitals:   01/03/20 1021 01/03/20 1304  BP: (!) 155/87 (!) 144/80  Pulse: 80 74  Resp: 17 15  Temp: 98.4 F (36.9 C)   SpO2: 100% 98%   Physical Exam Vitals and nursing note reviewed.  Constitutional:      General: She is not in acute distress.    Appearance: She is well-developed.  HENT:     Head: Normocephalic and atraumatic.     Right Ear: External ear normal.     Left Ear: External ear normal.     Nose: Nose normal.  Eyes:     Conjunctiva/sclera: Conjunctivae normal.  Cardiovascular:     Rate and Rhythm: Normal rate and regular rhythm.     Heart sounds: No murmur heard.   Pulmonary:     Effort: Pulmonary effort is normal. No respiratory distress.     Breath sounds: Normal breath sounds.  Abdominal:     Palpations: Abdomen is soft.     Tenderness: There is no abdominal tenderness.  Musculoskeletal:     Cervical back: Neck supple. No rigidity.     Right lower leg: No edema.     Left lower leg: No edema.  Skin:    General: Skin is warm and dry.  Neurological:     General: No focal deficit present.     Mental Status: She is alert. Mental status is at baseline. She is disoriented.  Psychiatric:        Mood and Affect: Mood normal.     No pronator drift.  No finger dysmetria.  Cranial nerves II through XII grossly intact.  Patient is full and symmetric strength in all extremities.  Sensation is intact to light touch of the lower extremities.  Patient is not oriented to date.  This is baseline per daughter. ____________________________________________   LABS (all labs ordered are listed, but only abnormal results are displayed)  Labs Reviewed  COMPREHENSIVE METABOLIC PANEL - Abnormal;  Notable for the following components:      Result Value   Sodium 123 (*)    Chloride 88 (*)    Glucose, Bld 100 (*)    Creatinine, Ser 1.59 (*)    Total Protein 8.2 (*)    GFR, Estimated 33 (*)    All other components within normal limits  CBC WITH DIFFERENTIAL/PLATELET  TSH  SEDIMENTATION RATE  URINALYSIS, COMPLETE (UACMP) WITH MICROSCOPIC  OSMOLALITY  TROPONIN I (HIGH SENSITIVITY)  TROPONIN I (HIGH SENSITIVITY)   ____________________________________________  EKG  Sinus rhythm with first-degree AV block with a PR interval of 270, normal axis, otherwise unremarkable intervals, nonspecific ST changes in lead III, lead II, and aVF  without other evidence of acute ischemia or other underlying arrhythmia. ____________________________________________  RADIOLOGY  ED MD interpretation: No evidence of acute intracranial hemorrhage, mass, or other acute process.  There is chronic atrophy and some chronic dural calcifications.  Official radiology report(s): CT Head Wo Contrast  Result Date: 01/03/2020 CLINICAL DATA:  Dizziness, nonspecific. Additional history provided: Headache. EXAM: CT HEAD WITHOUT CONTRAST TECHNIQUE: Contiguous axial images were obtained from the base of the skull through the vertex without intravenous contrast. COMPARISON:  Head CT 06/11/2019 FINDINGS: Brain: Mild generalized cerebral atrophy. Mild ill-defined hypoattenuation within the cerebral white matter is nonspecific, but compatible with chronic small vessel ischemic disease. Redemonstrated chronic dural thickening overlying the left cerebral convexity, unchanged as compared to the prior head CT of 06/11/2019 (for instance as seen on series 4, image 22). Also redemonstrated, there is a small focus of dural calcification overlying the left frontal lobe (series 4, image 25). There is no acute intracranial hemorrhage. No demarcated cortical infarct. No extra-axial fluid collection. No evidence of intracranial mass. No  midline shift. Vascular: No hyperdense vessel.  Atherosclerotic calcifications. Skull: Normal. Negative for fracture or focal lesion. Sinuses/Orbits: Visualized orbits show no acute finding. No significant paranasal sinus disease or mastoid effusion at the imaged levels IMPRESSION: No evidence of acute intracranial abnormality. Mild cerebral atrophy and chronic small vessel ischemic disease, stable as compared to the head CT of 06/11/2019. Redemonstrated chronic dural thickening overlying left cerebral convexity with sites of dural calcification. Electronically Signed   By: Kellie Simmering DO   On: 01/03/2020 13:36    ____________________________________________   PROCEDURES  Procedure(s) performed (including Critical Care):  .Critical Care Performed by: Lucrezia Starch, MD Authorized by: Lucrezia Starch, MD   Critical care provider statement:    Critical care time (minutes):  45   Critical care time was exclusive of:  Separately billable procedures and treating other patients   Critical care was necessary to treat or prevent imminent or life-threatening deterioration of the following conditions:  Cardiac failure and metabolic crisis   Critical care was time spent personally by me on the following activities:  Discussions with consultants, evaluation of patient's response to treatment, examination of patient, ordering and performing treatments and interventions, ordering and review of laboratory studies, ordering and review of radiographic studies, pulse oximetry, re-evaluation of patient's condition, obtaining history from patient or surrogate and review of old charts     ____________________________________________   INITIAL IMPRESSION / [place] / ED COURSE        Patient presents with post history exam for assessment of headache and generalized weakness per daughter but "just not feeling quite right" per patient.  Patient is demented and not oriented but at baseline per  daughter.  Patient is hypertensive with a BP of 155/87 otherwise stable vital signs on room air.  Patient otherwise has a nonfocal neuro exam.  Differential includes but is not limited to primary headache disorder, CVA, temporal arteritis, intracranial hemorrhage, NPH, UTI, metabolic derangements, anemia, and hypothyroidism.  No historical or exam findings to suggest an acute infectious process, traumatic injury, or toxic ingestion.  Very low suspicion for cavernous sinus thrombosis at this time.  No focal deficits on exam or findings on CT head to suggest CVA.  Patient has no tenderness over her bilateral temporal areas and ESR is not elevated I will suspicion for temporal arteritis at this time.  CT head is not consistent with intracranial hemorrhage or NPH and there is no history  of urinary incontinence.  Patient is noted to be hyponatremic and have evidence of an AKI on her CMP.  Her sodium of 123 is compared to 127 5 months ago and her creatinine of 1.59 as compared to 1.075 months ago.  These prior dates are when patient was last admitted.  TSH and troponin are unremarkable.  Low suspicion for ACS at this time.  We will plan to send urine to assess for evidence of cystitis and further work-up of hyponatremia.  Given hyponatremia and AKI will plan to admit to medicine service for further evaluation and management.  Patient given IV fluids while in emergency room.  Admitted in stable condition.    ____________________________________________   FINAL CLINICAL IMPRESSION(S) / ED DIAGNOSES  Final diagnoses:  AKI (acute kidney injury) ([place])  Hyponatremia  Nonintractable headache, unspecified chronicity pattern, unspecified headache type    Medications  lactated ringers bolus 500 mL (500 mLs Intravenous New Bag/Given 01/03/20 1350)  acetaminophen (TYLENOL) tablet 1,000 mg (1,000 mg Oral Given 01/03/20 1350)     ED Discharge Orders    None       Note:  This document was prepared using  Dragon voice recognition software and may include unintentional dictation errors.   Lucrezia Starch, MD 01/03/20 [phone]

## 2020-01-03 NOTE — H&P (Signed)
History and Physical    PLEASE NOTE THAT DRAGON DICTATION SOFTWARE WAS USED IN THE CONSTRUCTION OF THIS NOTE.   JOCLYN ALSOBROOK WUJ:[id number] DOB: Mar 15, 1940 DOA: 01/03/2020  PCP: Gracelyn Nurse, MD Patient coming from: home   I have personally briefly reviewed patient's old medical records in [facility]  Chief Complaint: Headache  HPI: Helen Herring is a 79 y.o. female with medical history significant for essential hypertension, mild persistent asthma, peripheral neuropathy, dementia, who is admitted to [facility] on 01/03/2020 with acute kidney injury and acute hyponatremia after presenting from home to [facility] Emergency Department complaining of headache.   The following history of patient as well as her daughter in addition to my discussions with the emergency department physician and via chart review.  The patient reports intermittent frontal headache that started in the absence of any recent trauma, before spontaneously resolving during the late morning of 01/03/2020, without any subsequent recurrence.  Denies any associated acute focal weakness, acute focal paresthesias/numbness, new onset dysphagia, new onset dysarthria, vertigo, or acute change in vision, including no association with visual or olfactory aura.  Denies any corresponding neck discomfort or any associated nausea, or diarrhea.  Denies any subjective fever, chills, rigors, or generalized.  The patient also denies any recent chest pain, shortness of breath, palpitations, diaphoresis, or cough.  Denies any recent traveling or known COVID-19 exposures.  She also denies any recent dysuria, gross hematuria, or change in urinary urgency/frequency.  Daughter conveys the confusion was asked by the patient at the time of her presentation to the ED today represents her baseline mental status.  Of note, the patient was hospitalized during May 2021 for acute kidney injury and acute  hyponatremia in the setting of urinary tract infection.  At that time, there is felt contributions to both AKI as well as a hyponatremia from relative dehydration.  She was subsequently treated with IV fluid, and presenting serum sodium of 119, gradually improved to 127 on the day of discharge, 07/13/2019.  Initial creatinine on the day of the prior admission was found to be 1.33, improved to 1.07 on the day of discharge following interval IV fluid resuscitation.  Following these labs performed on 07/13/2019, does not appear that she has an additional interval laboratory evaluation.   Patient's medical history is notable for essential hypertension, for which she is on benazepril.  Additionally she has history of peripheral neuropathy, for which she is on gabapentin.     ED Course:  Vital signs in the ED were notable for the following: Temperature max 98.4; heart rate 74-80; blood pressure 144/82 155/87; respiratory rate 15-17, and oxygen saturation 98 to 100% room air.   Labs were notable for the following: BMP was notable for the following: Sodium 123 relative to most recent prior value of 127 on 07/13/2019, chloride 88, bicarbonate 22, BUN 19, creatinine 1.59 relative to the most recent prior value of 1.07 on 07/13/2019.  High-sensitivity troponin I x2 were found to be 5 on both occasions.  Serum osmolality 261.  CBC notable for white blood cell count of 8000.  Urinalysis was notable for no white blood cells, no bacteria nitrate negative, leukocyte Estrace negative, no red blood cells, and 5 ketones.  Routine screening COVID-19 PCR as well as influenza PCR performed in the ED tonight were found to be negative.  In the context and acute hyponatremia, noncontrast CT of the head was obtained, and showed no evidence of acute intracranial process,  while showing mild cerebral atrophy and chronic small vessel ischemic disease that was stable compared to most recent prior CT of the head in April 2021.  EKG showed sinus  rhythm with first-degree AV block, rate 74, PR interval 270, nonspecific T wave inversion in V1, unchanged from most recent prior EKG in April 2001, and no evidence of ST changes.  While in the ED, the following were administered: Acetaminophen 1 g p.o. x1.  Lactated Ringer's x500 cc bolus x1.  Subsequently, the patient was admitted to the med surge floor for further evaluation management of presenting acute kidney injury and acute hyponatremia.    Review of Systems: As per HPI otherwise 10 point review of systems negative.   Past Medical History:  Diagnosis Date  . Asthma   . Hypertension     Past Surgical History:  Procedure Laterality Date  . ANTERIOR VITRECTOMY Right 01/20/2018   Procedure: ANTERIOR VITRECTOMY;  Surgeon: Elliot CousinHarrow, Brian, MD;  Location: [facility] ORS;  Service: Ophthalmology;  Laterality: Right;  . CATARACT EXTRACTION W/PHACO Right 01/20/2018   Procedure: CATARACT EXTRACTION PHACO AND INTRAOCULAR LENS PLACEMENT (IOC);  Surgeon: Elliot CousinHarrow, Brian, MD;  Location: [facility] ORS;  Service: Ophthalmology;  Laterality: Right;  Lot# [id number] H US: 01:49.4 CDE: 21.64  . CHOLECYSTECTOMY      Social History:  reports that she has never smoked. She has never used smokeless tobacco. She reports that she does not drink alcohol and does not use drugs.   No Known Allergies  History reviewed. No pertinent family history.   Prior to Admission medications   Medication Sig Start Date End Date Taking? Authorizing Provider  benazepril (LOTENSIN) 20 MG tablet Take 1 tablet (20 mg total) by mouth daily. 07/14/19   Marrion CoyZhang, Dekui, MD  dorzolamide-timolol (COSOPT) 22.3-6.8 MG/ML ophthalmic solution Place 1 drop into both eyes in the morning and at bedtime. 06/19/19   [provider]  gabapentin (NEURONTIN) 600 MG tablet Take 600 mg by mouth 3 (three) times daily.    [provider]  travoprost, benzalkonium, (TRAVATAN) 0.004 % ophthalmic solution Place 1 drop into both eyes at bedtime.     [provider]     Objective    Physical Exam: Vitals:   01/03/20 1021 01/03/20 1304  BP: (!) 155/87 (!) 144/80  Pulse: 80 74  Resp: 17 15  Temp: 98.4 F (36.9 C)   TempSrc: Oral   SpO2: 100% 98%    General: appears to be stated age; alert;  Skin: warm, dry, no rash Head:  AT/[place] Mouth:  Oral mucosa membranes appear dry, normal dentition Neck: supple; trachea midline Heart:  RRR; did not appreciate any M/R/G Lungs: CTAB, did not appreciate any wheezes, rales, or rhonchi Abdomen: + BS; soft, ND, NT Vascular: 2+ pedal pulses b/l; 2+ radial pulses b/l Extremities: no peripheral edema, no muscle wasting Neuro: strength and sensation intact in upper and lower extremities b/l  Labs on Admission: I have personally reviewed following labs and imaging studies  CBC: Recent Labs  Lab 01/03/20 1344  WBC 8.0  NEUTROABS 5.7  HGB 13.0  HCT 37.7  MCV 92.4  PLT 325   Basic Metabolic Panel: Recent Labs  Lab 01/03/20 1344  NA 123*  K 4.6  CL 88*  CO2 22  GLUCOSE 100*  BUN 19  CREATININE 1.59*  CALCIUM 9.0   GFR: CrCl cannot be calculated (Unknown ideal weight.). Liver Function Tests: Recent Labs  Lab 01/03/20 1344  AST 22  ALT 11  ALKPHOS 85  BILITOT 1.0  PROT 8.2*  ALBUMIN 4.4   No results for input(s): LIPASE, AMYLASE in the last 168 hours. No results for input(s): AMMONIA in the last 168 hours. Coagulation Profile: No results for input(s): INR, PROTIME in the last 168 hours. Cardiac Enzymes: No results for input(s): CKTOTAL, CKMB, CKMBINDEX, TROPONINI in the last 168 hours. BNP (last 3 results) No results for input(s): PROBNP in the last 8760 hours. HbA1C: No results for input(s): HGBA1C in the last 72 hours. CBG: No results for input(s): GLUCAP in the last 168 hours. Lipid Profile: No results for input(s): CHOL, HDL, LDLCALC, TRIG, CHOLHDL, LDLDIRECT in the last 72 hours. Thyroid Function Tests: Recent Labs    01/03/20 1344  TSH 2.114    Anemia Panel: No results for input(s): VITAMINB12, FOLATE, FERRITIN, TIBC, IRON, RETICCTPCT in the last 72 hours. Urine analysis:    Component Value Date/Time   COLORURINE STRAW (A) 07/12/2019 1810   APPEARANCEUR CLEAR (A) 07/12/2019 1810   LABSPEC 1.004 (L) 07/12/2019 1810   PHURINE 6.0 07/12/2019 1810   GLUCOSEU NEGATIVE 07/12/2019 1810   HGBUR NEGATIVE 07/12/2019 1810   BILIRUBINUR NEGATIVE 07/12/2019 1810   KETONESUR NEGATIVE 07/12/2019 1810   PROTEINUR NEGATIVE 07/12/2019 1810   NITRITE NEGATIVE 07/12/2019 1810   LEUKOCYTESUR NEGATIVE 07/12/2019 1810    Radiological Exams on Admission: CT Head Wo Contrast  Result Date: 01/03/2020 CLINICAL DATA:  Dizziness, nonspecific. Additional history provided: Headache. EXAM: CT HEAD WITHOUT CONTRAST TECHNIQUE: Contiguous axial images were obtained from the base of the skull through the vertex without intravenous contrast. COMPARISON:  Head CT 06/11/2019 FINDINGS: Brain: Mild generalized cerebral atrophy. Mild ill-defined hypoattenuation within the cerebral white matter is nonspecific, but compatible with chronic small vessel ischemic disease. Redemonstrated chronic dural thickening overlying the left cerebral convexity, unchanged as compared to the prior head CT of 06/11/2019 (for instance as seen on series 4, image 22). Also redemonstrated, there is a small focus of dural calcification overlying the left frontal lobe (series 4, image 25). There is no acute intracranial hemorrhage. No demarcated cortical infarct. No extra-axial fluid collection. No evidence of intracranial mass. No midline shift. Vascular: No hyperdense vessel.  Atherosclerotic calcifications. Skull: Normal. Negative for fracture or focal lesion. Sinuses/Orbits: Visualized orbits show no acute finding. No significant paranasal sinus disease or mastoid effusion at the imaged levels IMPRESSION: No evidence of acute intracranial abnormality. Mild cerebral atrophy and chronic small  vessel ischemic disease, stable as compared to the head CT of 06/11/2019. Redemonstrated chronic dural thickening overlying left cerebral convexity with sites of dural calcification. Electronically Signed   By: Jackey Loge DO   On: 01/03/2020 13:36     EKG: Independently reviewed, with result as described above.    Assessment/Plan   Helen Herring is a 79 y.o. female with medical history significant for essential hypertension, mild persistent asthma, peripheral neuropathy, dementia, who is admitted to [facility] on 01/03/2020 with acute kidney injury and acute hyponatremia after presenting from home to [facility] Emergency Department complaining of headache.    Principal Problem:   AKI (acute kidney injury) (HCC) Active Problems:   Acute hyponatremia   Benign essential HTN   Generalized weakness   Headache   Peripheral neuropathy    #) Acute kidney injury: Presenting labs reflect creatinine of 1.59 relative to most recent prior value of 1.07 on 07/13/2019.  In the context of concomitant hypochloremia as history of AKI prompting hospitalization 2021 that resolved with gentle interval IV fluid  resuscitation, suspect AKI is once again prerenal in nature due to mild dehydration, with pharmacologic complication from home ACE inhibitor.  Urinalysis obtained today showed no evidence of urinary casts.  Will provide gentle IV fluids overnight, and repeat renal function in the morning.  Plan: Lactated Ringer's at 50 cc/h overnight.  Monitor strict I's and O's daily weights.  Attempt to avoid nephrotoxic agents.  Hold home ACE inhibitor for now.  Add on random urine sodium as well as random urine creatinine for the purpose of calculating Fena.  Check CPK.       #) Acute hypoosmolar hypovolemic hyponatremia: Presenting serum sodium noted to be 123 compared to most recent prior value of 127 on 07/13/2019.  Suspect this is multifactorial in nature, with contributions mild  dehydration contribution from gabapentin which can be associated with hyponatremia.  Will expand existing work-up to further evaluate for any additional contribution from SIADH treatment would potentially change if significant element of SIADH was determined to be present.  For now, we will proceed with gentle IV fluids while awaiting results of additional urine studies for evaluation of SIADH, as further detailed below.  Of note, serum osmolality was checked in the ED today and found to be low at 261, confirming suspected hyperosmolar etiology.  Plan: Add on random urine sodium as well as urine osmolality.  Repeat BMP in the morning.  Gentle overnight IV fluids in the form of LR at 50 cc/h.  Monitor strict I's and O's and daily weights.  Hold home gabapentin for now.  Check TSH.  We will also check chest x-ray to evaluate for any acute cardiopulmonary process, as this could contribute to SIADH-associated hyponatremia.      #) Generalized weakness: 2 to 3 days of generalized weakness in the neurologic deficits.  Suspect multifactorial etiology stemming from initial mild dehydration leading to additional element of acute hyponatremia.  No evidence of underlying infectious process at this time, as urinalysis is not suggestive of UTI and screening COVID-19 PCR was found to be negative.  Will provide gentle IV fluid resuscitative efforts and contact treatment of acute hyponatremia as above.  Will also check TSH and vitamin B12 level.  In the setting of dehydration induced acute kidney injury, there may have been a subsequent pharmacologic contribution to the patient's generalized weakness stemming from inclusion of gabapentin home medication regimen, as this medication is exclusively renally cleared, with presenting acute kidney injury likely resulting in relative increase in circulating serum levels of this medication  Plan: IV fluids as above.  Work-up and management of hyponatremia, as above.  Check TSH and  B12.  I placed a physical therapy consult to occur in the morning.  Hold gabapentin for now.     #) Frontal headache:  suspect that this represented a tension headache that has subsequently resolved without recurrence.  Her presentation in the absence of any acute focal neurologic deficits as well as no evidence of acute ischemic changes on CT scan render the possibility of acute ischemic CVA to be unlikely.   Plan: As needed acetaminophen.      #) Essential hypertension: On benazepril as her sole outpatient antihypertensive agent.  Noted to be in the systolic 140s.  Plan: Benazepril overnight the setting of presenting acute kidney injury.  Close monitoring of ensuing blood pressure via routine vital signs.     #) Mild persistent asthma: Documented history of such in the absence of any evidence of acute exacerbation.  Plan: As needed albuterol inhaler.  Add on serum magnesium level.      #) Peripheral neuropathy: On gabapentin at home.  In the context of recurrent hyponatremia, will hold for now as this medication can be associated with hyponatremia.   Plan: Hold home gabapentin, as above.    DVT prophylaxis: scd's  Code Status: Full code Family Communication: none Disposition Plan: Per Rounding Team Consults called: none  Admission status: inpatient; med-surg.    PLEASE NOTE THAT DRAGON DICTATION SOFTWARE WAS USED IN THE CONSTRUCTION OF THIS NOTE.   Angie Fava DO [facility] Pager [phone] From 12PM- 12AM  Otherwise, please contact night-coverage  [email or web address] Password [facility]  01/03/2020, 3:49 PM

## 2020-01-03 NOTE — ED Notes (Signed)
Pt daughter reports pt has some generalized weakness developing over months now, with very early dementia. Pt reports headache with no vision changes. Pt has difficulty expressing how she feels. Moderate memory impairment. Pt oriented to self, place, and situation. Able to communicate appropriately.  EDP at bedside. Normal neuro exam with mNIHSS

## 2020-01-03 NOTE — ED Notes (Signed)
Patient's daughter, Rosalene Billings, would like to get a phone call when patient is moved to the floor. She said it doesn't matter what the hour is. Her number is [phone].

## 2020-01-03 NOTE — ED Notes (Signed)
Pt to CT

## 2020-01-04 ENCOUNTER — Encounter: Payer: Self-pay | Admitting: Internal Medicine

## 2020-01-04 DIAGNOSIS — N179 Acute kidney failure, unspecified: Secondary | ICD-10-CM | POA: Diagnosis not present

## 2020-01-04 LAB — COMPREHENSIVE METABOLIC PANEL
ALT: 12 U/L (ref 0–44)
AST: 19 U/L (ref 15–41)
Albumin: 3.9 g/dL (ref 3.5–5.0)
Alkaline Phosphatase: 76 U/L (ref 38–126)
Anion gap: 11 (ref 5–15)
BUN: 20 mg/dL (ref 8–23)
CO2: 23 mmol/L (ref 22–32)
Calcium: 9 mg/dL (ref 8.9–10.3)
Chloride: 89 mmol/L — ABNORMAL LOW (ref 98–111)
Creatinine, Ser: 1.35 mg/dL — ABNORMAL HIGH (ref 0.44–1.00)
GFR, Estimated: 40 mL/min — ABNORMAL LOW (ref >60)
Glucose, Bld: 91 mg/dL (ref 70–99)
Potassium: 4.2 mmol/L (ref 3.5–5.1)
Sodium: 123 mmol/L — ABNORMAL LOW (ref 135–145)
Total Bilirubin: 1.2 mg/dL (ref 0.3–1.2)
Total Protein: 7.5 g/dL (ref 6.5–8.1)

## 2020-01-04 LAB — CBC
HCT: 34.9 % — ABNORMAL LOW (ref 36.0–46.0)
Hemoglobin: 12.3 g/dL (ref 12.0–15.0)
MCH: 32.4 pg (ref 26.0–34.0)
MCHC: 35.2 g/dL (ref 30.0–36.0)
MCV: 91.8 fL (ref 80.0–100.0)
Platelets: 310 10*3/uL (ref 150–400)
RBC: 3.8 MIL/uL — ABNORMAL LOW (ref 3.87–5.11)
RDW: 12.8 % (ref 11.5–15.5)
WBC: 7.9 10*3/uL (ref 4.0–10.5)
nRBC: 0 % (ref 0.0–0.2)

## 2020-01-04 LAB — CK: Total CK: 87 U/L (ref 38–234)

## 2020-01-04 LAB — MAGNESIUM: Magnesium: 2.5 mg/dL — ABNORMAL HIGH (ref 1.7–2.4)

## 2020-01-04 MED ORDER — INFLUENZA VAC A&B SA ADJ QUAD 0.5 ML IM PRSY
0.5000 mL | PREFILLED_SYRINGE | INTRAMUSCULAR | Status: AC
  Administered 2020-01-05: 0.5 mL via INTRAMUSCULAR
  Filled 2020-01-04: qty 0.5

## 2020-01-04 MED ORDER — PNEUMOCOCCAL VAC POLYVALENT 25 MCG/0.5ML IJ INJ
0.5000 mL | INJECTION | INTRAMUSCULAR | Status: AC
  Administered 2020-01-06: 09:00:00 0.5 mL via INTRAMUSCULAR
  Filled 2020-01-04: qty 0.5

## 2020-01-04 NOTE — Plan of Care (Signed)

## 2020-01-04 NOTE — Evaluation (Addendum)
Physical Therapy Evaluation Patient Details Name: Helen Herring MRN: [id number] DOB: [birth year] Today's Date: 01/04/2020   History of Present Illness  presented to ER secondary to headache; admitted for management of AKI, acute hyponatremia  Clinical Impression  Patient resting in bed upon arrival to room; awake and agreeable to session with min encouragement.  Oriented to self, general location; follows simple commands, pleasant and cooperative throughout session.  Bilat UE/LE strength and ROM grossly symmetrical and WFL; no focal weakness appreciated.  Able to complete bed mobility with mod indep; sit/stand, basic transfers and gait (200') without assist device, cga/close sup.  Demonstrates decreased step height/length bilat; decreased trunk rotation and arm swing; decreased cadence and overall gait speed. No overt buckling or LOB; consistent cuing for direction, task recall Appears to be at/near baseline level of functional ability.  Will maintain on caseload to ensure progressive mobilization throughout hospital stay.  Anticipate no formal PT needs upon discharge.    Follow Up Recommendations No PT follow up    Equipment Recommendations       Recommendations for Other Services       Precautions / Restrictions Precautions Precautions: Fall Restrictions Weight Bearing Restrictions: No      Mobility  Bed Mobility Overal bed mobility: Modified Independent                  Transfers Overall transfer level: Needs assistance Equipment used: None Transfers: Sit to/from Stand Sit to Stand: Supervision            Ambulation/Gait Ambulation/Gait assistance: Supervision Gait Distance (Feet): 200 Feet Assistive device: None       General Gait Details: decreased step height/length bilat; decreased trunk rotation and arm swing; decreased cadence and overall gait speed. No overt buckling or LOB; consistent cuing for direction, task recall  Stairs             Wheelchair Mobility    Modified Rankin (Stroke Patients Only)       Balance Overall balance assessment: Needs assistance Sitting-balance support: No upper extremity supported;Feet supported Sitting balance-Leahy Scale: Normal     Standing balance support: No upper extremity supported Standing balance-Leahy Scale: Fair                               Pertinent Vitals/Pain Pain Assessment: No/denies pain    Home Living                   Additional Comments: Patient reports living alone in single-level apartment.  Questionable historian; will verify with family as available.    Prior Function Level of Independence: Independent         Comments: Patient reports indep with ADLs, household mobilization within apartment; reports driving, no home O2, no fall history.  Patient reports living alone in single-level apartment.  Questionable historian; will verify with family as available.     Hand Dominance        Extremity/Trunk Assessment   Upper Extremity Assessment Upper Extremity Assessment: Overall WFL for tasks assessed    Lower Extremity Assessment Lower Extremity Assessment: Overall WFL for tasks assessed       Communication   Communication: No difficulties  Cognition Arousal/Alertness: Awake/alert Behavior During Therapy: WFL for tasks assessed/performed Overall Cognitive Status: No family/caregiver present to determine baseline cognitive functioning  General Comments: oriented to self and location as hospital; follows simple commands, pleasant and cooperative.  Limited insight into deficits and overall need for assist.      General Comments      Exercises     Assessment/Plan    PT Assessment Patient needs continued PT services  PT Problem List Decreased balance;Decreased activity tolerance;Decreased mobility;Decreased safety awareness;Decreased knowledge of precautions;Decreased  cognition       PT Treatment Interventions DME instruction;Gait training;Functional mobility training;Therapeutic activities;Therapeutic exercise;Balance training;Patient/family education;Cognitive remediation    PT Goals (Current goals can be found in the Care Plan section)  Acute Rehab PT Goals Patient Stated Goal: to get warm-it's cold! PT Goal Formulation: With patient Time For Goal Achievement: 01/18/20 Potential to Achieve Goals: Good    Frequency Min 2X/week   Barriers to discharge        Co-evaluation               AM-PAC PT "6 Clicks" Mobility  Outcome Measure Help needed turning from your back to your side while in a flat bed without using bedrails?: None Help needed moving from lying on your back to sitting on the side of a flat bed without using bedrails?: None Help needed moving to and from a bed to a chair (including a wheelchair)?: A Little Help needed standing up from a chair using your arms (e.g., wheelchair or bedside chair)?: A Little Help needed to walk in hospital room?: A Little Help needed climbing 3-5 steps with a railing? : A Little 6 Click Score: 20    End of Session Equipment Utilized During Treatment: Gait belt Activity Tolerance: Patient tolerated treatment well Patient left: in bed;with call bell/phone within reach;with bed alarm set Nurse Communication: Mobility status PT Visit Diagnosis: Muscle weakness (generalized) (M62.81);Difficulty in walking, not elsewhere classified (R26.2)    Time: 1020-1040 PT Time Calculation (min) (ACUTE ONLY): 20 min   Charges:   PT Evaluation $PT Eval Low Complexity: 1 Low          Shanora Christensen H. Manson Passey, PT, DPT, NCS 01/04/20, 2:27 PM [phone]

## 2020-01-04 NOTE — Progress Notes (Signed)
Report called to [facility] on 1C

## 2020-01-04 NOTE — Progress Notes (Signed)
PROGRESS NOTE    Helen Herring  QQP:[id number] DOB: [birth year] DOA: 01/03/2020 PCP: Gracelyn Nurse, MD    Assessment & Plan:   Principal Problem:   AKI (acute kidney injury) (HCC) Active Problems:   Acute hyponatremia   Benign essential HTN   Generalized weakness   Headache   Peripheral neuropathy   Helen Herring is a 79 y.o. female with medical history significant for essential hypertension, mild persistent asthma, peripheral neuropathy, dementia, who is admitted to [facility] on 01/03/2020 with acute kidney injury and acute hyponatremia after presenting from home to [facility] complaining of headache.    #) Acute kidney injury:  Presenting labs reflect creatinine of 1.59 relative to most recent prior value of 1.07 on 07/13/2019.   --s/p gentle hydration PLAN: --Hold home ACEi --d/c MIVF  #) Hyponatremia 2/2 SIADH Presenting serum sodium noted to be 123 compared to most recent prior value of 127 on 07/13/2019.   --urine and osm studies consistent with SIADH PLAN: --d/c MIVF --fluid restrict to 1200 cc per day --if Na doesn't improve tomorrow, will consult nephrology  #) Generalized weakness:  2 to 3 days of generalized weakness without neurologic deficits.  No evidence of underlying infectious process at this time, as urinalysis is not suggestive of UTI and screening COVID-19 PCR was found to be negative.   PLAN: --PT eval --Hold home gabapentin for now  #) Frontal headache, resolved suspect that this represented a tension headache that has subsequently resolved without recurrence.  Her presentation in the absence of any acute focal neurologic deficits as well as no evidence of acute ischemic changes on CT scan render the possibility of acute ischemic CVA to be unlikely.  Plan:  --As needed acetaminophen.  #) Essential hypertension:  On benazepril as her sole outpatient antihypertensive agent.   Plan:   --Hold home benazepril due to AKI  #) Hx of Mild persistent asthma:  stable Plan:  --As needed albuterol inhaler.    #) Peripheral neuropathy:  On gabapentin at home.   Plan: --Hold home gabapentin for now   DVT prophylaxis: SCD/Compression stockings Code Status: Full code  Family Communication:  Status is: inpatient Dispo:   The patient is from: home Anticipated d/c is to: home Anticipated d/c date is: 2-3 days Patient currently is not medically stable to d/c due to: Hyponatremia, not improved yet.   Subjective and Interval History:  Pt reported "doing alright", and denied all complaints.     Objective: Vitals:   01/04/20 0814 01/04/20 0900 01/04/20 1025 01/04/20 1136  BP:  101/89 139/66 111/66  Pulse:  68 72 81  Resp:  17 18 18   Temp:   98.4 F (36.9 C) 98.4 F (36.9 C)  TempSrc:   Oral Oral  SpO2: 98% 96% 90% 99%    Intake/Output Summary (Last 24 hours) at 01/04/2020 1929 Last data filed at 01/04/2020 1902 Gross per 24 hour  Intake 100 ml  Output --  Net 100 ml   There were no vitals filed for this visit.  Examination:   Constitutional: NAD, AAOx3 HEENT: conjunctivae and lids normal, EOMI CV: No cyanosis.   RESP: normal respiratory effort, on RA Extremities: No effusions, edema in BLE SKIN: warm, dry and intact Neuro: II - XII grossly intact.   Psych: Normal mood and affect.     Data Reviewed: I have personally reviewed following labs and imaging studies  CBC: Recent Labs  Lab 01/03/20 1344 01/04/20 0352  WBC 8.0 7.9  NEUTROABS 5.7  --   HGB 13.0 12.3  HCT 37.7 34.9*  MCV 92.4 91.8  PLT 325 310   Basic Metabolic Panel: Recent Labs  Lab 01/03/20 1344 01/04/20 0352  NA 123* 123*  K 4.6 4.2  CL 88* 89*  CO2 22 23  GLUCOSE 100* 91  BUN 19 20  CREATININE 1.59* 1.35*  CALCIUM 9.0 9.0  MG  --  2.5*   GFR: CrCl cannot be calculated (Unknown ideal weight.). Liver Function Tests: Recent Labs  Lab 01/03/20 1344  01/04/20 0352  AST 22 19  ALT 11 12  ALKPHOS 85 76  BILITOT 1.0 1.2  PROT 8.2* 7.5  ALBUMIN 4.4 3.9   No results for input(s): LIPASE, AMYLASE in the last 168 hours. No results for input(s): AMMONIA in the last 168 hours. Coagulation Profile: No results for input(s): INR, PROTIME in the last 168 hours. Cardiac Enzymes: Recent Labs  Lab 01/04/20 0352  CKTOTAL 87   BNP (last 3 results) No results for input(s): PROBNP in the last 8760 hours. HbA1C: No results for input(s): HGBA1C in the last 72 hours. CBG: No results for input(s): GLUCAP in the last 168 hours. Lipid Profile: No results for input(s): CHOL, HDL, LDLCALC, TRIG, CHOLHDL, LDLDIRECT in the last 72 hours. Thyroid Function Tests: Recent Labs    01/03/20 1344  TSH 2.114   Anemia Panel: No results for input(s): VITAMINB12, FOLATE, FERRITIN, TIBC, IRON, RETICCTPCT in the last 72 hours. Sepsis Labs: No results for input(s): PROCALCITON, LATICACIDVEN in the last 168 hours.  Recent Results (from the past 240 hour(s))  Respiratory Panel by RT PCR (Flu A&B, Covid) - Nasopharyngeal Swab     Status: None   Collection Time: 01/03/20  4:53 PM   Specimen: Nasopharyngeal Swab  Result Value Ref Range Status   SARS Coronavirus 2 by RT PCR NEGATIVE NEGATIVE Final    Comment: (NOTE) SARS-CoV-2 target nucleic acids are NOT DETECTED.  The SARS-CoV-2 RNA is generally detectable in upper respiratoy specimens during the acute phase of infection. The lowest concentration of SARS-CoV-2 viral copies this assay can detect is 131 copies/mL. A negative result does not preclude SARS-Cov-2 infection and should not be used as the sole basis for treatment or other patient management decisions. A negative result may occur with  improper specimen collection/handling, submission of specimen other than nasopharyngeal swab, presence of viral mutation(s) within the areas targeted by this assay, and inadequate number of viral copies (<131  copies/mL). A negative result must be combined with clinical observations, patient history, and epidemiological information. The expected result is Negative.  Fact Sheet for Patients:  https://www.moore.com/  Fact Sheet for Healthcare Providers:  https://www.young.biz/  This test is no t yet approved or cleared by the [place] FDA and  has been authorized for detection and/or diagnosis of SARS-CoV-2 by FDA under an Emergency Use Authorization (EUA). This EUA will remain  in effect (meaning this test can be used) for the duration of the COVID-19 declaration under Section 564(b)(1) of the Act, 21 U.S.C. section 360bbb-3(b)(1), unless the authorization is terminated or revoked sooner.     Influenza A by PCR NEGATIVE NEGATIVE Final   Influenza B by PCR NEGATIVE NEGATIVE Final    Comment: (NOTE) The Xpert Xpress SARS-CoV-2/FLU/RSV assay is intended as an aid in  the diagnosis of influenza from Nasopharyngeal swab specimens and  should not be used as a sole basis for treatment. Nasal washings and  aspirates are unacceptable for  Xpert Xpress SARS-CoV-2/FLU/RSV  testing.  Fact Sheet for Patients: https://www.moore.com/  Fact Sheet for Healthcare Providers: https://www.young.biz/  This test is not yet approved or cleared by the [place] FDA and  has been authorized for detection and/or diagnosis of SARS-CoV-2 by  FDA under an Emergency Use Authorization (EUA). This EUA will remain  in effect (meaning this test can be used) for the duration of the  Covid-19 declaration under Section 564(b)(1) of the Act, 21  U.S.C. section 360bbb-3(b)(1), unless the authorization is  terminated or revoked. Performed at [facility], [address] [place], [place] [address]       Radiology Studies: CT Head Wo Contrast  Result Date: 01/03/2020 CLINICAL DATA:  Dizziness, nonspecific. Additional  history provided: Headache. EXAM: CT HEAD WITHOUT CONTRAST TECHNIQUE: Contiguous axial images were obtained from the base of the skull through the vertex without intravenous contrast. COMPARISON:  Head CT 06/11/2019 FINDINGS: Brain: Mild generalized cerebral atrophy. Mild ill-defined hypoattenuation within the cerebral white matter is nonspecific, but compatible with chronic small vessel ischemic disease. Redemonstrated chronic dural thickening overlying the left cerebral convexity, unchanged as compared to the prior head CT of 06/11/2019 (for instance as seen on series 4, image 22). Also redemonstrated, there is a small focus of dural calcification overlying the left frontal lobe (series 4, image 25). There is no acute intracranial hemorrhage. No demarcated cortical infarct. No extra-axial fluid collection. No evidence of intracranial mass. No midline shift. Vascular: No hyperdense vessel.  Atherosclerotic calcifications. Skull: Normal. Negative for fracture or focal lesion. Sinuses/Orbits: Visualized orbits show no acute finding. No significant paranasal sinus disease or mastoid effusion at the imaged levels IMPRESSION: No evidence of acute intracranial abnormality. Mild cerebral atrophy and chronic small vessel ischemic disease, stable as compared to the head CT of 06/11/2019. Redemonstrated chronic dural thickening overlying left cerebral convexity with sites of dural calcification. Electronically Signed   By: Jackey Loge DO   On: 01/03/2020 13:36   DG Chest Port 1 View  Result Date: 01/03/2020 CLINICAL DATA:  Headache EXAM: PORTABLE CHEST 1 VIEW COMPARISON:  Report 08/14/2019 FINDINGS: Mild diffuse interstitial opacity, likely chronic bronchitic change. No consolidation or effusion. Cardiomediastinal silhouette within normal limits. Aortic atherosclerosis. No pneumothorax. IMPRESSION: No active disease. Mild chronic bronchitic changes. Electronically Signed   By: Jasmine Pang M.D.   On: 01/03/2020 18:41      Scheduled Meds:  dorzolamide  1 drop Both Eyes BID   And   timolol  1 drop Both Eyes BID   [START ON 01/05/2020] influenza vaccine adjuvanted  0.5 mL Intramuscular Tomorrow-1000   latanoprost  1 drop Both Eyes QHS   [START ON 01/05/2020] pneumococcal 23 valent vaccine  0.5 mL Intramuscular Tomorrow-1000   Continuous Infusions:   LOS: 1 day     Darlin Priestly, MD [facility] If 7PM-7AM, please contact night-coverage 01/04/2020, 7:29 PM

## 2020-01-05 DIAGNOSIS — N179 Acute kidney failure, unspecified: Secondary | ICD-10-CM | POA: Diagnosis not present

## 2020-01-05 LAB — CBC
HCT: 36.8 % (ref 36.0–46.0)
Hemoglobin: 13.1 g/dL (ref 12.0–15.0)
MCH: 32.5 pg (ref 26.0–34.0)
MCHC: 35.6 g/dL (ref 30.0–36.0)
MCV: 91.3 fL (ref 80.0–100.0)
Platelets: 331 10*3/uL (ref 150–400)
RBC: 4.03 MIL/uL (ref 3.87–5.11)
RDW: 12.6 % (ref 11.5–15.5)
WBC: 7.1 10*3/uL (ref 4.0–10.5)
nRBC: 0 % (ref 0.0–0.2)

## 2020-01-05 LAB — BASIC METABOLIC PANEL
Anion gap: 12 (ref 5–15)
BUN: 20 mg/dL (ref 8–23)
CO2: 23 mmol/L (ref 22–32)
Calcium: 9.3 mg/dL (ref 8.9–10.3)
Chloride: 92 mmol/L — ABNORMAL LOW (ref 98–111)
Creatinine, Ser: 1.39 mg/dL — ABNORMAL HIGH (ref 0.44–1.00)
GFR, Estimated: 39 mL/min — ABNORMAL LOW (ref >60)
Glucose, Bld: 99 mg/dL (ref 70–99)
Potassium: 4 mmol/L (ref 3.5–5.1)
Sodium: 127 mmol/L — ABNORMAL LOW (ref 135–145)

## 2020-01-05 LAB — MAGNESIUM: Magnesium: 2.5 mg/dL — ABNORMAL HIGH (ref 1.7–2.4)

## 2020-01-05 MED ORDER — HEPARIN SODIUM (PORCINE) 5000 UNIT/ML IJ SOLN
5000.0000 [IU] | Freq: Three times a day (TID) | INTRAMUSCULAR | Status: DC
  Administered 2020-01-05 – 2020-01-07 (×7): 5000 [IU] via SUBCUTANEOUS
  Filled 2020-01-05 (×7): qty 1

## 2020-01-05 MED ORDER — ENSURE ENLIVE PO LIQD
237.0000 mL | Freq: Two times a day (BID) | ORAL | Status: DC
  Administered 2020-01-06 – 2020-01-07 (×4): 237 mL via ORAL

## 2020-01-05 MED ORDER — ADULT MULTIVITAMIN W/MINERALS CH
1.0000 | ORAL_TABLET | Freq: Every day | ORAL | Status: DC
  Administered 2020-01-06 – 2020-01-07 (×2): 1 via ORAL
  Filled 2020-01-05 (×2): qty 1

## 2020-01-05 NOTE — Progress Notes (Signed)
Initial Nutrition Assessment  DOCUMENTATION CODES:   Obesity unspecified  INTERVENTION:   Ensure Enlive po BID, each supplement provides 350 kcal and 20 grams of protein  MVI daily   NUTRITION DIAGNOSIS:   Inadequate oral intake related to acute illness as evidenced by meal completion < 50%.  GOAL:   Patient will meet greater than or equal to 90% of their needs  MONITOR:   PO intake, Supplement acceptance, Labs, Weight trends, I & O's, Skin  REASON FOR ASSESSMENT:   Malnutrition Screening Tool    ASSESSMENT:   79 y.o. female with medical history significant for essential hypertension, mild persistent asthma, peripheral neuropathy and mild dementia who is admitted to [facility] on 01/03/2020 with acute kidney injury and hyponatremia   Met with pt in room. Pt is a poor historian and is only able to provide basic nutrition related history. Pt reports good appetite and oral intake at baseline; pt reports that she has always been a good eater. Pt's lunch tray was sitting on her side table untouched; pt reports that she is planning to eat lunch but that she has been sleeping. Pt reports that she did eat breakfast this morning but she is unable to recall what or how much she ate. There are no documented meal intakes in chart. Pt reports that she does not drink any supplements at home but that she is willing to try chocolate Ensure in hospital. RD will add supplements and MVI to help pt meet her estimated needs. Per chart, pt appears fairly weight stable at baseline. Pt's UBW appears to be ~190-195lbs.   Medications reviewed and include: heparin  Labs reviewed: Na 127(H), creat 1.39(H)  NUTRITION - FOCUSED PHYSICAL EXAM:    Most Recent Value  Orbital Region No depletion  Upper Arm Region No depletion  Thoracic and Lumbar Region No depletion  Buccal Region No depletion  Temple Region No depletion  Clavicle Bone Region No depletion  Clavicle and Acromion  Bone Region No depletion  Scapular Bone Region No depletion  Dorsal Hand No depletion  Patellar Region No depletion  Anterior Thigh Region No depletion  Posterior Calf Region No depletion  Edema (RD Assessment) None  Hair Reviewed  Eyes Reviewed  Mouth Reviewed  Skin Reviewed  Nails Reviewed     Diet Order:   Diet Order            Diet regular Room service appropriate? Yes; Fluid consistency: Thin; Fluid restriction: 1200 mL Fluid  Diet effective now                EDUCATION NEEDS:   Education needs have been addressed  Skin:  Skin Assessment: Reviewed RN Assessment  Last BM:  10/17  Height:   Ht Readings from Last 1 Encounters:  01/05/20 _0  (1.549 m)    Weight:   Wt Readings from Last 1 Encounters:  01/05/20 85.7 kg    Ideal Body Weight:  47.7 kg  BMI:  Body mass index is 35.7 kg/m.  Estimated Nutritional Needs:   Kcal:  1700-1900kcal/day  Protein:  85-95g/day  Fluid:  1.2L/day per MD  Koleen Distance MS, RD, LDN Please refer to [facility] for RD and/or RD on-call/weekend/after hours pager

## 2020-01-05 NOTE — Care Management Important Message (Signed)
Important Message  Patient Details  Name: Helen Herring MRN: [id number] Date of Birth: 1940/06/23   Medicare Important Message Given:  N/A - LOS <3 / Initial given by admissions     [name] 01/05/2020, 8:12 AM

## 2020-01-05 NOTE — Progress Notes (Signed)
PROGRESS NOTE    Helen Herring  JKD:[id number] DOB: 14-Apr-1940 DOA: 01/03/2020 PCP: Gracelyn Nurse, MD    Assessment & Plan:   Principal Problem:   AKI (acute kidney injury) (HCC) Active Problems:   Acute hyponatremia   Benign essential HTN   Generalized weakness   Headache   Peripheral neuropathy   Helen Herring is a 79 y.o. female with medical history significant for essential hypertension, mild persistent asthma, peripheral neuropathy, dementia, who is admitted to [facility] on 01/03/2020 with acute kidney injury and acute hyponatremia after presenting from home to [facility] complaining of headache.    #) Acute kidney injury:  Presenting labs reflect creatinine of 1.59 relative to most recent prior value of 1.07 on 07/13/2019.   --s/p gentle hydration PLAN: --cont to home home benazepril  #) Hyponatremia 2/2 SIADH Presenting serum sodium noted to be 123 compared to most recent prior value of 127 on 07/13/2019.   --urine and osm studies consistent with SIADH.  MIVF d/c'ed. --Na improved from 123 to 127 with fluid restriction. PLAN: --cont fluid restriction to 1200 cc per day. --if Na doesn't improve tomorrow, will consult nephrology  #) Generalized weakness:  2 to 3 days of generalized weakness without neurologic deficits.  No evidence of underlying infectious process at this time, as urinalysis is not suggestive of UTI and screening COVID-19 PCR was found to be negative.   PLAN: --PT eval, pt has not PT needs --Hold home gabapentin for now  #) Frontal headache, resolved suspect that this represented a tension headache that has subsequently resolved without recurrence.  Her presentation in the absence of any acute focal neurologic deficits as well as no evidence of acute ischemic changes on CT scan render the possibility of acute ischemic CVA to be unlikely.  Plan:  --As needed acetaminophen.  #) Essential  hypertension:  On benazepril as her sole outpatient antihypertensive agent.   --BP acceptable Plan:  --cont to hold home benazepril due to AKI  #) Hx of Mild persistent asthma:  stable Plan:  --As needed albuterol inhaler.    #) Peripheral neuropathy:  On gabapentin at home.   Plan: --Hold home gabapentin for now   DVT prophylaxis: SCD/Compression stockings Code Status: Full code  Family Communication:  Status is: inpatient Dispo:   The patient is from: home Anticipated d/c is to: home Anticipated d/c date is: 1-2 days Patient currently is not medically stable to d/c due to: Hyponatremia, still low at 127.  If improve tomorrow, can discharge.   Subjective and Interval History:  Pt reported doing better, but not eating much.  Na improved from 123 to 127 with fluid restriction.   Objective: Vitals:   01/05/20 0814 01/05/20 0817 01/05/20 1303 01/05/20 1613  BP: 117/76   (!) 144/65  Pulse: 82   77  Resp:      Temp:  98.3 F (36.8 C)  98.4 F (36.9 C)  TempSrc:  Oral  Oral  SpO2: 97%   100%  Weight:      Height:   5\' 1"  (1.549 m)     Intake/Output Summary (Last 24 hours) at 01/05/2020 1919 Last data filed at 01/05/2020 1700 Gross per 24 hour  Intake 320 ml  Output 550 ml  Net -230 ml   Filed Weights   01/05/20 0500  Weight: 85.7 kg    Examination:   Constitutional: NAD, AAOx3 HEENT: conjunctivae and lids normal, EOMI CV: No cyanosis.  RESP: normal respiratory effort, on RA Extremities: No effusions, edema in BLE SKIN: warm, dry and intact Neuro: II - XII grossly intact.   Psych: Normal mood and affect.     Data Reviewed: I have personally reviewed following labs and imaging studies  CBC: Recent Labs  Lab 01/03/20 1344 01/04/20 0352 01/05/20 0433  WBC 8.0 7.9 7.1  NEUTROABS 5.7  --   --   HGB 13.0 12.3 13.1  HCT 37.7 34.9* 36.8  MCV 92.4 91.8 91.3  PLT 325 310 331   Basic Metabolic Panel: Recent Labs  Lab 01/03/20 1344  01/04/20 0352 01/05/20 0433  NA 123* 123* 127*  K 4.6 4.2 4.0  CL 88* 89* 92*  CO2 22 23 23   GLUCOSE 100* 91 99  BUN 19 20 20   CREATININE 1.59* 1.35* 1.39*  CALCIUM 9.0 9.0 9.3  MG  --  2.5* 2.5*   GFR: Estimated Creatinine Clearance: 33.7 mL/min (A) (by C-G formula based on SCr of 1.39 mg/dL (H)). Liver Function Tests: Recent Labs  Lab 01/03/20 1344 01/04/20 0352  AST 22 19  ALT 11 12  ALKPHOS 85 76  BILITOT 1.0 1.2  PROT 8.2* 7.5  ALBUMIN 4.4 3.9   No results for input(s): LIPASE, AMYLASE in the last 168 hours. No results for input(s): AMMONIA in the last 168 hours. Coagulation Profile: No results for input(s): INR, PROTIME in the last 168 hours. Cardiac Enzymes: Recent Labs  Lab 01/04/20 0352  CKTOTAL 87   BNP (last 3 results) No results for input(s): PROBNP in the last 8760 hours. HbA1C: No results for input(s): HGBA1C in the last 72 hours. CBG: No results for input(s): GLUCAP in the last 168 hours. Lipid Profile: No results for input(s): CHOL, HDL, LDLCALC, TRIG, CHOLHDL, LDLDIRECT in the last 72 hours. Thyroid Function Tests: Recent Labs    01/03/20 1344  TSH 2.114   Anemia Panel: No results for input(s): VITAMINB12, FOLATE, FERRITIN, TIBC, IRON, RETICCTPCT in the last 72 hours. Sepsis Labs: No results for input(s): PROCALCITON, LATICACIDVEN in the last 168 hours.  Recent Results (from the past 240 hour(s))  Respiratory Panel by RT PCR (Flu A&B, Covid) - Nasopharyngeal Swab     Status: None   Collection Time: 01/03/20  4:53 PM   Specimen: Nasopharyngeal Swab  Result Value Ref Range Status   SARS Coronavirus 2 by RT PCR NEGATIVE NEGATIVE Final    Comment: (NOTE) SARS-CoV-2 target nucleic acids are NOT DETECTED.  The SARS-CoV-2 RNA is generally detectable in upper respiratoy specimens during the acute phase of infection. The lowest concentration of SARS-CoV-2 viral copies this assay can detect is 131 copies/mL. A negative result does not  preclude SARS-Cov-2 infection and should not be used as the sole basis for treatment or other patient management decisions. A negative result may occur with  improper specimen collection/handling, submission of specimen other than nasopharyngeal swab, presence of viral mutation(s) within the areas targeted by this assay, and inadequate number of viral copies (<131 copies/mL). A negative result must be combined with clinical observations, patient history, and epidemiological information. The expected result is Negative.  Fact Sheet for Patients:  01/05/20  Fact Sheet for Healthcare Providers:  01/05/20  This test is no t yet approved or cleared by the https://www.moore.com/ FDA and  has been authorized for detection and/or diagnosis of SARS-CoV-2 by FDA under an Emergency Use Authorization (EUA). This EUA will remain  in effect (meaning this test can be used) for the duration of  the COVID-19 declaration under Section 564(b)(1) of the Act, 21 U.S.C. section 360bbb-3(b)(1), unless the authorization is terminated or revoked sooner.     Influenza A by PCR NEGATIVE NEGATIVE Final   Influenza B by PCR NEGATIVE NEGATIVE Final    Comment: (NOTE) The Xpert Xpress SARS-CoV-2/FLU/RSV assay is intended as an aid in  the diagnosis of influenza from Nasopharyngeal swab specimens and  should not be used as a sole basis for treatment. Nasal washings and  aspirates are unacceptable for Xpert Xpress SARS-CoV-2/FLU/RSV  testing.  Fact Sheet for Patients: https://www.moore.com/  Fact Sheet for Healthcare Providers: https://www.young.biz/  This test is not yet approved or cleared by the [place] FDA and  has been authorized for detection and/or diagnosis of SARS-CoV-2 by  FDA under an Emergency Use Authorization (EUA). This EUA will remain  in effect (meaning this test can be used) for the  duration of the  Covid-19 declaration under Section 564(b)(1) of the Act, 21  U.S.C. section 360bbb-3(b)(1), unless the authorization is  terminated or revoked. Performed at [facility], [address], [place], [place] [address]       Radiology Studies: No results found.   Scheduled Meds: . dorzolamide  1 drop Both Eyes BID   And  . timolol  1 drop Both Eyes BID  . feeding supplement  237 mL Oral BID BM  . heparin injection (subcutaneous)  5,000 Units Subcutaneous Q8H  . latanoprost  1 drop Both Eyes QHS  . [START ON 01/06/2020] multivitamin with minerals  1 tablet Oral Daily  . pneumococcal 23 valent vaccine  0.5 mL Intramuscular Tomorrow-1000   Continuous Infusions:   LOS: 2 days     Darlin Priestly, MD [facility] If 7PM-7AM, please contact night-coverage 01/05/2020, 7:19 PM

## 2020-01-05 NOTE — Plan of Care (Signed)
  Problem: Education: Goal: Knowledge of General Education information will improve Description Including pain rating scale, medication(s)/side effects and non-pharmacologic comfort measures Outcome: Progressing   

## 2020-01-06 DIAGNOSIS — N179 Acute kidney failure, unspecified: Secondary | ICD-10-CM | POA: Diagnosis not present

## 2020-01-06 LAB — BASIC METABOLIC PANEL
Anion gap: 11 (ref 5–15)
BUN: 22 mg/dL (ref 8–23)
CO2: 23 mmol/L (ref 22–32)
Calcium: 9.2 mg/dL (ref 8.9–10.3)
Chloride: 91 mmol/L — ABNORMAL LOW (ref 98–111)
Creatinine, Ser: 1.45 mg/dL — ABNORMAL HIGH (ref 0.44–1.00)
GFR, Estimated: 37 mL/min — ABNORMAL LOW (ref >60)
Glucose, Bld: 88 mg/dL (ref 70–99)
Potassium: 3.7 mmol/L (ref 3.5–5.1)
Sodium: 125 mmol/L — ABNORMAL LOW (ref 135–145)

## 2020-01-06 LAB — CBC
HCT: 37.5 % (ref 36.0–46.0)
Hemoglobin: 13.1 g/dL (ref 12.0–15.0)
MCH: 31.9 pg (ref 26.0–34.0)
MCHC: 34.9 g/dL (ref 30.0–36.0)
MCV: 91.2 fL (ref 80.0–100.0)
Platelets: 319 10*3/uL (ref 150–400)
RBC: 4.11 MIL/uL (ref 3.87–5.11)
RDW: 12.5 % (ref 11.5–15.5)
WBC: 6.9 10*3/uL (ref 4.0–10.5)
nRBC: 0 % (ref 0.0–0.2)

## 2020-01-06 LAB — MAGNESIUM: Magnesium: 2.4 mg/dL (ref 1.7–2.4)

## 2020-01-06 NOTE — Progress Notes (Signed)
PROGRESS NOTE    [name]  WUJ:[id number] DOB: 29-Jul-1940 DOA: 01/03/2020 PCP: Gracelyn Nurse, MD    Assessment & Plan:   Principal Problem:   AKI (acute kidney injury) (HCC) Active Problems:   Acute hyponatremia   Benign essential HTN   Generalized weakness   Headache   Peripheral neuropathy   Helen Herring is a 79 y.o. female with medical history significant for essential hypertension, mild persistent asthma, peripheral neuropathy, dementia, who is admitted to [facility] on 01/03/2020 with acute kidney injury and acute hyponatremia after presenting from home to [facility] Emergency Department complaining of headache.    #) Acute kidney injury:  Presenting labs reflect creatinine of 1.59 relative to most recent prior value of 1.07 on 07/13/2019.   --s/p gentle hydration PLAN: --continue to hold home home benazepril --avoid IVF 2/2 SIADH  #) Hyponatremia 2/2 SIADH Presenting serum sodium noted to be 123 compared to most recent prior value of 127 on 07/13/2019.   --urine and osm studies consistent with SIADH.  MIVF d/c'ed. --Na improved from 123 to 127 with fluid restriction, however, back down to 125 this morning. PLAN: --cont fluid restriction to 1200 cc per day. --nephrology consult today  #) Generalized weakness:  2 to 3 days of generalized weakness without neurologic deficits.  No evidence of underlying infectious process at this time, as urinalysis is not suggestive of UTI and screening COVID-19 PCR was found to be negative.   PLAN: --PT eval, pt has not PT needs  #) Frontal headache, resolved suspect that this represented a tension headache that has subsequently resolved without recurrence.  Her presentation in the absence of any acute focal neurologic deficits as well as no evidence of acute ischemic changes on CT scan render the possibility of acute ischemic CVA to be unlikely.  Plan:  --tylenol PRN  #) Essential  hypertension:  On benazepril as her sole outpatient antihypertensive agent.   --BP acceptable Plan:  --cont to hold home benazepril due to AKI  #) Hx of Mild persistent asthma:  stable Plan:  --As needed albuterol inhaler.    #) Peripheral neuropathy:  On gabapentin at home.   Plan: --Hold home gabapentin for now   DVT prophylaxis: SCD/Compression stockings Code Status: Full code  Family Communication: daughter updated on the phone today Status is: inpatient Dispo:   The patient is from: home Anticipated d/c is to: home Anticipated d/c date is: 1-2 days Patient currently is not medically stable to d/c due to: Hyponatremia, nephrology consult pending.     Subjective and Interval History:  Na worsened this morning.  Pt had no complaints.  Did well with PT.  Nephrology consult.   Objective: Vitals:   01/06/20 0500 01/06/20 0732 01/06/20 1210 01/06/20 1604  BP: 128/74 113/67 106/64 (!) 88/51  Pulse: 79 72 75 83  Resp: 16 16 12 16   Temp: 98.8 F (37.1 C) 98.9 F (37.2 C) 98.4 F (36.9 C) 98.4 F (36.9 C)  TempSrc:  Oral Oral Oral  SpO2: 100% 99% 99% 99%  Weight: 86.2 kg     Height:        Intake/Output Summary (Last 24 hours) at 01/06/2020 1713 Last data filed at 01/06/2020 1432 Gross per 24 hour  Intake --  Output 200 ml  Net -200 ml   Filed Weights   01/05/20 0500 01/06/20 0500  Weight: 85.7 kg 86.2 kg    Examination:   Constitutional: NAD, AAOx3 HEENT: conjunctivae and lids  normal, EOMI CV: No cyanosis.   RESP: normal respiratory effort, on RA Extremities: No effusions, edema in BLE SKIN: warm, dry and intact Neuro: II - XII grossly intact.   Psych: Normal mood and affect.     Data Reviewed: I have personally reviewed following labs and imaging studies  CBC: Recent Labs  Lab 01/03/20 1344 01/04/20 0352 01/05/20 0433 01/06/20 0441  WBC 8.0 7.9 7.1 6.9  NEUTROABS 5.7  --   --   --   HGB 13.0 12.3 13.1 13.1  HCT 37.7 34.9* 36.8  37.5  MCV 92.4 91.8 91.3 91.2  PLT 325 310 331 319   Basic Metabolic Panel: Recent Labs  Lab 01/03/20 1344 01/04/20 0352 01/05/20 0433 01/06/20 0441  NA 123* 123* 127* 125*  K 4.6 4.2 4.0 3.7  CL 88* 89* 92* 91*  CO2 22 23 23 23   GLUCOSE 100* 91 99 88  BUN 19 20 20 22   CREATININE 1.59* 1.35* 1.39* 1.45*  CALCIUM 9.0 9.0 9.3 9.2  MG  --  2.5* 2.5* 2.4   GFR: Estimated Creatinine Clearance: 32.4 mL/min (A) (by C-G formula based on SCr of 1.45 mg/dL (H)). Liver Function Tests: Recent Labs  Lab 01/03/20 1344 01/04/20 0352  AST 22 19  ALT 11 12  ALKPHOS 85 76  BILITOT 1.0 1.2  PROT 8.2* 7.5  ALBUMIN 4.4 3.9   No results for input(s): LIPASE, AMYLASE in the last 168 hours. No results for input(s): AMMONIA in the last 168 hours. Coagulation Profile: No results for input(s): INR, PROTIME in the last 168 hours. Cardiac Enzymes: Recent Labs  Lab 01/04/20 0352  CKTOTAL 87   BNP (last 3 results) No results for input(s): PROBNP in the last 8760 hours. HbA1C: No results for input(s): HGBA1C in the last 72 hours. CBG: No results for input(s): GLUCAP in the last 168 hours. Lipid Profile: No results for input(s): CHOL, HDL, LDLCALC, TRIG, CHOLHDL, LDLDIRECT in the last 72 hours. Thyroid Function Tests: No results for input(s): TSH, T4TOTAL, FREET4, T3FREE, THYROIDAB in the last 72 hours. Anemia Panel: No results for input(s): VITAMINB12, FOLATE, FERRITIN, TIBC, IRON, RETICCTPCT in the last 72 hours. Sepsis Labs: No results for input(s): PROCALCITON, LATICACIDVEN in the last 168 hours.  Recent Results (from the past 240 hour(s))  Respiratory Panel by RT PCR (Flu A&B, Covid) - Nasopharyngeal Swab     Status: None   Collection Time: 01/03/20  4:53 PM   Specimen: Nasopharyngeal Swab  Result Value Ref Range Status   SARS Coronavirus 2 by RT PCR NEGATIVE NEGATIVE Final    Comment: (NOTE) SARS-CoV-2 target nucleic acids are NOT DETECTED.  The SARS-CoV-2 RNA is generally  detectable in upper respiratoy specimens during the acute phase of infection. The lowest concentration of SARS-CoV-2 viral copies this assay can detect is 131 copies/mL. A negative result does not preclude SARS-Cov-2 infection and should not be used as the sole basis for treatment or other patient management decisions. A negative result may occur with  improper specimen collection/handling, submission of specimen other than nasopharyngeal swab, presence of viral mutation(s) within the areas targeted by this assay, and inadequate number of viral copies (<131 copies/mL). A negative result must be combined with clinical observations, patient history, and epidemiological information. The expected result is Negative.  Fact Sheet for Patients:  01/06/20  Fact Sheet for Healthcare Providers:  01/05/20  This test is no t yet approved or cleared by the https://www.moore.com/ and  has been authorized for  detection and/or diagnosis of SARS-CoV-2 by FDA under an Emergency Use Authorization (EUA). This EUA will remain  in effect (meaning this test can be used) for the duration of the COVID-19 declaration under Section 564(b)(1) of the Act, 21 U.S.C. section 360bbb-3(b)(1), unless the authorization is terminated or revoked sooner.     Influenza A by PCR NEGATIVE NEGATIVE Final   Influenza B by PCR NEGATIVE NEGATIVE Final    Comment: (NOTE) The Xpert Xpress SARS-CoV-2/FLU/RSV assay is intended as an aid in  the diagnosis of influenza from Nasopharyngeal swab specimens and  should not be used as a sole basis for treatment. Nasal washings and  aspirates are unacceptable for Xpert Xpress SARS-CoV-2/FLU/RSV  testing.  Fact Sheet for Patients: https://www.moore.com/  Fact Sheet for Healthcare Providers: https://www.young.biz/  This test is not yet approved or cleared by the [place] FDA and   has been authorized for detection and/or diagnosis of SARS-CoV-2 by  FDA under an Emergency Use Authorization (EUA). This EUA will remain  in effect (meaning this test can be used) for the duration of the  Covid-19 declaration under Section 564(b)(1) of the Act, 21  U.S.C. section 360bbb-3(b)(1), unless the authorization is  terminated or revoked. Performed at [facility], [address], [place], [place] [address]       Radiology Studies: No results found.   Scheduled Meds: . dorzolamide  1 drop Both Eyes BID   And  . timolol  1 drop Both Eyes BID  . feeding supplement  237 mL Oral BID BM  . heparin injection (subcutaneous)  5,000 Units Subcutaneous Q8H  . latanoprost  1 drop Both Eyes QHS  . multivitamin with minerals  1 tablet Oral Daily   Continuous Infusions:   LOS: 3 days     Darlin Priestly, MD [facility] If 7PM-7AM, please contact night-coverage 01/06/2020, 5:13 PM

## 2020-01-07 LAB — CBC
HCT: 36.7 % (ref 36.0–46.0)
Hemoglobin: 12.9 g/dL (ref 12.0–15.0)
MCH: 32 pg (ref 26.0–34.0)
MCHC: 35.1 g/dL (ref 30.0–36.0)
MCV: 91.1 fL (ref 80.0–100.0)
Platelets: 334 10*3/uL (ref 150–400)
RBC: 4.03 MIL/uL (ref 3.87–5.11)
RDW: 12.5 % (ref 11.5–15.5)
WBC: 8.9 10*3/uL (ref 4.0–10.5)
nRBC: 0 % (ref 0.0–0.2)

## 2020-01-07 LAB — BASIC METABOLIC PANEL
Anion gap: 11 (ref 5–15)
BUN: 32 mg/dL — ABNORMAL HIGH (ref 8–23)
CO2: 25 mmol/L (ref 22–32)
Calcium: 9.1 mg/dL (ref 8.9–10.3)
Chloride: 92 mmol/L — ABNORMAL LOW (ref 98–111)
Creatinine, Ser: 1.53 mg/dL — ABNORMAL HIGH (ref 0.44–1.00)
GFR, Estimated: 35 mL/min — ABNORMAL LOW (ref >60)
Glucose, Bld: 103 mg/dL — ABNORMAL HIGH (ref 70–99)
Potassium: 3.7 mmol/L (ref 3.5–5.1)
Sodium: 128 mmol/L — ABNORMAL LOW (ref 135–145)

## 2020-01-07 LAB — MAGNESIUM: Magnesium: 2.6 mg/dL — ABNORMAL HIGH (ref 1.7–2.4)

## 2020-01-07 MED ORDER — SODIUM CHLORIDE 1 G PO TABS: 1.0000 g | ORAL_TABLET | Freq: Three times a day (TID) | ORAL | 2 refills | Status: AC

## 2020-01-07 MED ORDER — BENAZEPRIL HCL 20 MG PO TABS: ORAL_TABLET | ORAL | 0 refills | Status: AC

## 2020-01-07 MED ORDER — GABAPENTIN 600 MG PO TABS: 300.0000 mg | ORAL_TABLET | Freq: Three times a day (TID) | ORAL | Status: DC

## 2020-01-07 NOTE — Progress Notes (Signed)
Patient discharged to home with her daughter via wheelchair.  Volunteer escorted patient out.

## 2020-01-07 NOTE — Discharge Summary (Signed)
Physician Discharge Summary   Helen Herring  female DOB: [birth year]  VEH:[id number]  PCP: Gracelyn Nurse, MD  Admit date: 01/03/2020 Discharge date: 01/07/2020  Admitted From: home Disposition:  Home Daughter updated on the discharge plans prior to discharge.  CODE STATUS: Full code  Discharge Instructions    Ambulatory referral to Nephrology   Complete by: As directed    SIADH, CKD   Discharge instructions   Complete by: As directed    As I have discussed with your daughter, you have a condition called SIADH which causes low sodium level in your body.  The treatment is reduced fluid intake (about 45 ounces per day) and salt tablets (ordered as 1g three times a day with meals).  Also your Maxzide is discontinued.  Please hold your Benazepril until follow up with kidney doctor due to your reduced kidney function.    Your gabapentin is decreased to 300 mg three times a day due to reduced kidney function.  You will follow up with outpatient nephrology.     Dr. Darlin Priestly [facility] Course:  For full details, please see H&P, progress notes, consult notes and ancillary notes.  Briefly,  Helen Herring a 79 y.o.femalewith medical history significant foressential hypertension, mild persistent asthma, peripheral neuropathy, dementia,who was admitted to [facility] on10/27/2021with acute kidney injury and acute hyponatremiaafter presenting from home to [facility] Emergency Department complaining of headache.   #)Acute kidney injury:  # Chronic kidney disease stage IIIA Presenting creatinine of 1.59 relative to most recent prior value of 1.07 on 07/13/2019. Pt received gentle hydration overnight which was d/c'ed the next day after dx of SIADH.  Home triamterene and hydrochlorothiazide d/c'ed.  Home benazepril held due to AKI.  Nephrology consulted and will continue to follow pt as outpatient.  #) Hyponatremia 2/2  SIADH Presenting serum sodium noted to be 123 compared to most recent prior value of 127 on 07/13/2019.  urine and osm studies consistent with SIADH.  MIVF d/c'ed.  Na improved from 123 to 127 with fluid restriction, however, back down to 125 the day after, so nephrology consulted, who rec continued fluid restriction and salt tablets.  Pt was discharged on fluid restriction of 45 ounces per day (about 1200 cc) and salt tablets 1g TID.  Pt will follow up with nephrology as outpatient.  #) Generalized weakness:  2 to 3 days of generalized weakness without neurologic deficits.No evidence of underlying infectious process at this time, as urinalysis is not suggestive of UTI and screening COVID-19 PCR was found to be negative. PT eval, pt had no PT needs  #) Frontal headache, resolved suspect that this represented a tension headache that has subsequently resolved without recurrence. Her presentation in the absence of any acute focal neurologic deficits as well as no evidence of acute ischemic changes on CT scan render the possibility of acute ischemic CVA to be unlikely.  #) Essential hypertension:  Home triamterene and hydrochlorothiazide d/c'ed due to hyponatremia.  Home benazepril held due to AKI.  BP acceptable during hospitalization without home BP meds.  #) Hx of Mild persistent asthma:  Stable.  As needed albuterol inhaler.   #) Peripheral neuropathy:  On gabapentin 600 mg TID at home.  Daughter reported pt often lethargic at home and was concerned about the dose being too high.  Given pt's reduced kidney function, gabapentin was reduced to 300 mg TID.   Discharge Diagnoses:  Principal  Problem:   AKI (acute kidney injury) (HCC) Active Problems:   Acute hyponatremia   Benign essential HTN   Generalized weakness   Headache   Peripheral neuropathy    Discharge Instructions:  Allergies as of 01/07/2020   No Known Allergies     Medication List    STOP taking these  medications   triamterene-hydrochlorothiazide 37.5-25 MG tablet Commonly known as: MAXZIDE-25     TAKE these medications   benazepril 20 MG tablet Commonly known as: LOTENSIN Hold until follow up with kidney doctor. What changed:   how much to take  how to take this  when to take this  additional instructions   dorzolamide-timolol 22.3-6.8 MG/ML ophthalmic solution Commonly known as: COSOPT Place 1 drop into both eyes in the morning and at bedtime.   gabapentin 600 MG tablet Commonly known as: NEURONTIN Take 0.5 tablets (300 mg total) by mouth 3 (three) times daily. What changed: how much to take   sodium chloride 1 g tablet Take 1 tablet (1 g total) by mouth 3 (three) times daily with meals.   travoprost (benzalkonium) 0.004 % ophthalmic solution Commonly known as: TRAVATAN Place 1 drop into both eyes at bedtime.        Follow-up Information    Gracelyn Nurse, MD. Schedule an appointment as soon as possible for a visit in 1 week(s).   Specialty: Internal Medicine Contact information: [address] [place] [address] [phone]        Lamont Dowdy, MD. Schedule an appointment as soon as possible for a visit in 1 week(s).   Specialty: Nephrology Why: SIADH and CKD Contact information: [address] [place] [address] [phone]               No Known Allergies   The results of significant diagnostics from this hospitalization (including imaging, microbiology, ancillary and laboratory) are listed below for reference.   Consultations:   Procedures/Studies: CT Head Wo Contrast  Result Date: 01/03/2020 CLINICAL DATA:  Dizziness, nonspecific. Additional history provided: Headache. EXAM: CT HEAD WITHOUT CONTRAST TECHNIQUE: Contiguous axial images were obtained from the base of the skull through the vertex without intravenous contrast. COMPARISON:  Head CT 06/11/2019 FINDINGS: Brain: Mild generalized  cerebral atrophy. Mild ill-defined hypoattenuation within the cerebral white matter is nonspecific, but compatible with chronic small vessel ischemic disease. Redemonstrated chronic dural thickening overlying the left cerebral convexity, unchanged as compared to the prior head CT of 06/11/2019 (for instance as seen on series 4, image 22). Also redemonstrated, there is a small focus of dural calcification overlying the left frontal lobe (series 4, image 25). There is no acute intracranial hemorrhage. No demarcated cortical infarct. No extra-axial fluid collection. No evidence of intracranial mass. No midline shift. Vascular: No hyperdense vessel.  Atherosclerotic calcifications. Skull: Normal. Negative for fracture or focal lesion. Sinuses/Orbits: Visualized orbits show no acute finding. No significant paranasal sinus disease or mastoid effusion at the imaged levels IMPRESSION: No evidence of acute intracranial abnormality. Mild cerebral atrophy and chronic small vessel ischemic disease, stable as compared to the head CT of 06/11/2019. Redemonstrated chronic dural thickening overlying left cerebral convexity with sites of dural calcification. Electronically Signed   By: Jackey Loge DO   On: 01/03/2020 13:36   DG Chest Port 1 View  Result Date: 01/03/2020 CLINICAL DATA:  Headache EXAM: PORTABLE CHEST 1 VIEW COMPARISON:  Report 08/14/2019 FINDINGS: Mild diffuse interstitial opacity, likely chronic bronchitic change. No consolidation or effusion. Cardiomediastinal  silhouette within normal limits. Aortic atherosclerosis. No pneumothorax. IMPRESSION: No active disease. Mild chronic bronchitic changes. Electronically Signed   By: Jasmine PangKim  Fujinaga M.D.   On: 01/03/2020 18:41      Labs: BNP (last 3 results) Recent Labs    08/14/19 0925  BNP 46.1   Basic Metabolic Panel: Recent Labs  Lab 01/03/20 1344 01/04/20 0352 01/05/20 0433 01/06/20 0441 01/07/20 0400  NA 123* 123* 127* 125* 128*  K 4.6 4.2 4.0  3.7 3.7  CL 88* 89* 92* 91* 92*  CO2 22 23 23 23 25   GLUCOSE 100* 91 99 88 103*  BUN 19 20 20 22  32*  CREATININE 1.59* 1.35* 1.39* 1.45* 1.53*  CALCIUM 9.0 9.0 9.3 9.2 9.1  MG  --  2.5* 2.5* 2.4 2.6*   Liver Function Tests: Recent Labs  Lab 01/03/20 1344 01/04/20 0352  AST 22 19  ALT 11 12  ALKPHOS 85 76  BILITOT 1.0 1.2  PROT 8.2* 7.5  ALBUMIN 4.4 3.9   No results for input(s): LIPASE, AMYLASE in the last 168 hours. No results for input(s): AMMONIA in the last 168 hours. CBC: Recent Labs  Lab 01/03/20 1344 01/04/20 0352 01/05/20 0433 01/06/20 0441 01/07/20 0400  WBC 8.0 7.9 7.1 6.9 8.9  NEUTROABS 5.7  --   --   --   --   HGB 13.0 12.3 13.1 13.1 12.9  HCT 37.7 34.9* 36.8 37.5 36.7  MCV 92.4 91.8 91.3 91.2 91.1  PLT 325 310 331 319 334   Cardiac Enzymes: Recent Labs  Lab 01/04/20 0352  CKTOTAL 87   BNP: Invalid input(s): POCBNP CBG: No results for input(s): GLUCAP in the last 168 hours. D-Dimer No results for input(s): DDIMER in the last 72 hours. Hgb A1c No results for input(s): HGBA1C in the last 72 hours. Lipid Profile No results for input(s): CHOL, HDL, LDLCALC, TRIG, CHOLHDL, LDLDIRECT in the last 72 hours. Thyroid function studies No results for input(s): TSH, T4TOTAL, T3FREE, THYROIDAB in the last 72 hours.  Invalid input(s): FREET3 Anemia work up No results for input(s): VITAMINB12, FOLATE, FERRITIN, TIBC, IRON, RETICCTPCT in the last 72 hours. Urinalysis    Component Value Date/Time   COLORURINE YELLOW (A) 01/03/2020 1554   APPEARANCEUR HAZY (A) 01/03/2020 1554   LABSPEC 1.012 01/03/2020 1554   PHURINE 7.0 01/03/2020 1554   GLUCOSEU NEGATIVE 01/03/2020 1554   HGBUR NEGATIVE 01/03/2020 1554   BILIRUBINUR NEGATIVE 01/03/2020 1554   KETONESUR 5 (A) 01/03/2020 1554   PROTEINUR NEGATIVE 01/03/2020 1554   NITRITE NEGATIVE 01/03/2020 1554   LEUKOCYTESUR NEGATIVE 01/03/2020 1554   Sepsis Labs Invalid input(s): PROCALCITONIN,  WBC,   LACTICIDVEN Microbiology Recent Results (from the past 240 hour(s))  Respiratory Panel by RT PCR (Flu A&B, Covid) - Nasopharyngeal Swab     Status: None   Collection Time: 01/03/20  4:53 PM   Specimen: Nasopharyngeal Swab  Result Value Ref Range Status   SARS Coronavirus 2 by RT PCR NEGATIVE NEGATIVE Final    Comment: (NOTE) SARS-CoV-2 target nucleic acids are NOT DETECTED.  The SARS-CoV-2 RNA is generally detectable in upper respiratoy specimens during the acute phase of infection. The lowest concentration of SARS-CoV-2 viral copies this assay can detect is 131 copies/mL. A negative result does not preclude SARS-Cov-2 infection and should not be used as the sole basis for treatment or other patient management decisions. A negative result may occur with  improper specimen collection/handling, submission of specimen other than nasopharyngeal swab, presence of viral mutation(s)  within the areas targeted by this assay, and inadequate number of viral copies (<131 copies/mL). A negative result must be combined with clinical observations, patient history, and epidemiological information. The expected result is Negative.  Fact Sheet for Patients:  https://www.moore.com/  Fact Sheet for Healthcare Providers:  https://www.young.biz/  This test is no t yet approved or cleared by the [place] FDA and  has been authorized for detection and/or diagnosis of SARS-CoV-2 by FDA under an Emergency Use Authorization (EUA). This EUA will remain  in effect (meaning this test can be used) for the duration of the COVID-19 declaration under Section 564(b)(1) of the Act, 21 U.S.C. section 360bbb-3(b)(1), unless the authorization is terminated or revoked sooner.     Influenza A by PCR NEGATIVE NEGATIVE Final   Influenza B by PCR NEGATIVE NEGATIVE Final    Comment: (NOTE) The Xpert Xpress SARS-CoV-2/FLU/RSV assay is intended as an aid in  the diagnosis of  influenza from Nasopharyngeal swab specimens and  should not be used as a sole basis for treatment. Nasal washings and  aspirates are unacceptable for Xpert Xpress SARS-CoV-2/FLU/RSV  testing.  Fact Sheet for Patients: https://www.moore.com/  Fact Sheet for Healthcare Providers: https://www.young.biz/  This test is not yet approved or cleared by the [place] FDA and  has been authorized for detection and/or diagnosis of SARS-CoV-2 by  FDA under an Emergency Use Authorization (EUA). This EUA will remain  in effect (meaning this test can be used) for the duration of the  Covid-19 declaration under Section 564(b)(1) of the Act, 21  U.S.C. section 360bbb-3(b)(1), unless the authorization is  terminated or revoked. Performed at [facility], [address] [place], [place] [address]      Total time spend on discharging this patient, including the last patient exam, discussing the hospital stay, instructions for ongoing care as it relates to all pertinent caregivers, as well as preparing the medical discharge records, prescriptions, and/or referrals as applicable, is 45 minutes.    Darlin Priestly, MD  [facility] 01/07/2020, 8:49 AM  If 7PM-7AM, please contact night-coverage

## 2020-01-07 NOTE — Consult Note (Signed)
[facility]  CONSULT NOTE    Date: 01/07/2020                  Patient Name:  Helen Herring  MRN: [id number]  DOB: 30-Jul-1940  Age / Sex: 79 y.o., female         PCP: Gracelyn Nurse, MD                 Service Requesting Consult: Dr. Fran Lowes                 Reason for Consult: Hyponatremia            History of Present Illness: Helen Herring has been admitted to [facility] for acute renal failure on chronic kidney disease. This was thought to be secondary to prerenal azotemia. She was treated with IV fluids. Patient was admitted with acute on chronic hyponatremia. This did improve with IV fluids but this was then held. Nephrology consulted   Medications: Outpatient medications: Medications Prior to Admission  Medication Sig Dispense Refill Last Dose  . dorzolamide-timolol (COSOPT) 22.3-6.8 MG/ML ophthalmic solution Place 1 drop into both eyes in the morning and at bedtime.   01/03/2020 at 0800  . travoprost, benzalkonium, (TRAVATAN) 0.004 % ophthalmic solution Place 1 drop into both eyes at bedtime.   01/02/2020 at 2000  . triamterene-hydrochlorothiazide (MAXZIDE-25) 37.5-25 MG tablet Take 1 tablet by mouth daily.   01/03/2020 at 0800  . [DISCONTINUED] benazepril (LOTENSIN) 20 MG tablet Take 1 tablet (20 mg total) by mouth daily. 30 tablet 0 01/03/2020 at 0800  . [DISCONTINUED] gabapentin (NEURONTIN) 600 MG tablet Take 600 mg by mouth 3 (three) times daily.   01/02/2020 at 2000    Current medications: Current Facility-Administered Medications  Medication Dose Route Frequency Provider Last Rate Last Admin  . acetaminophen (TYLENOL) tablet 650 mg  650 mg Oral Q6H PRN Howerter, Justin B, DO       Or  . acetaminophen (TYLENOL) suppository 650 mg  650 mg Rectal Q6H PRN Howerter, Justin B, DO      . albuterol (VENTOLIN HFA) 108 (90 Base) MCG/ACT inhaler 1-2 puff  1-2 puff Inhalation Q4H PRN Howerter, Justin B, DO      . dorzolamide (TRUSOPT) 2 % ophthalmic  solution 1 drop  1 drop Both Eyes BID Howerter, Justin B, DO   1 drop at 01/07/20 1023   And  . timolol (TIMOPTIC) 0.5 % ophthalmic solution 1 drop  1 drop Both Eyes BID Howerter, Justin B, DO   1 drop at 01/07/20 1020  . feeding supplement (ENSURE ENLIVE / ENSURE PLUS) liquid 237 mL  237 mL Oral BID BM Darlin Priestly, MD   237 mL at 01/07/20 1022  . heparin injection 5,000 Units  5,000 Units Subcutaneous Q8H Darlin Priestly, MD   5,000 Units at 01/07/20 0500  . latanoprost (XALATAN) 0.005 % ophthalmic solution 1 drop  1 drop Both Eyes QHS Howerter, Justin B, DO   1 drop at 01/05/20 2050  . melatonin tablet 5 mg  5 mg Oral QHS PRN Howerter, Justin B, DO      . multivitamin with minerals tablet 1 tablet  1 tablet Oral Daily Darlin Priestly, MD   1 tablet at 01/07/20 1019  . ondansetron (ZOFRAN) injection 4 mg  4 mg Intravenous Q6H PRN Howerter, Justin B, DO          Allergies: No Known Allergies    Past Medical History: Past Medical  History:  Diagnosis Date  . Asthma   . Hypertension      Past Surgical History: Past Surgical History:  Procedure Laterality Date  . ANTERIOR VITRECTOMY Right 01/20/2018   Procedure: ANTERIOR VITRECTOMY;  Surgeon: Elliot Cousin, MD;  Location: [facility] ORS;  Service: Ophthalmology;  Laterality: Right;  . CATARACT EXTRACTION W/PHACO Right 01/20/2018   Procedure: CATARACT EXTRACTION PHACO AND INTRAOCULAR LENS PLACEMENT (IOC);  Surgeon: Elliot Cousin, MD;  Location: [facility] ORS;  Service: Ophthalmology;  Laterality: Right;  Lot# [id number] H [place]: 01:49.4 CDE: 21.64  . CHOLECYSTECTOMY       Family History: History reviewed. No pertinent family history.   Social History: Social History   Socioeconomic History  . Marital status: Widowed    Spouse name: Not on file  . Number of children: Not on file  . Years of education: Not on file  . Highest education level: Not on file  Occupational History  . Not on file  Tobacco Use  . Smoking status: Never Smoker  . Smokeless  tobacco: Never Used  Vaping Use  . Vaping Use: Never used  Substance and Sexual Activity  . Alcohol use: Never  . Drug use: Never  . Sexual activity: Not on file  Other Topics Concern  . Not on file  Social History Narrative  . Not on file   Social Determinants of Health   Financial Resource Strain:   . Difficulty of Paying Living Expenses: Not on file  Food Insecurity:   . Worried About Programme researcher, broadcasting/film/video in the Last Year: Not on file  . Ran Out of Food in the Last Year: Not on file  Transportation Needs:   . Lack of Transportation (Medical): Not on file  . Lack of Transportation (Non-Medical): Not on file  Physical Activity:   . Days of Exercise per Week: Not on file  . Minutes of Exercise per Session: Not on file  Stress:   . Feeling of Stress : Not on file  Social Connections:   . Frequency of Communication with Friends and Family: Not on file  . Frequency of Social Gatherings with Friends and Family: Not on file  . Attends Religious Services: Not on file  . Active Member of Clubs or Organizations: Not on file  . Attends Banker Meetings: Not on file  . Marital Status: Not on file  Intimate Partner Violence:   . Fear of Current or Ex-Partner: Not on file  . Emotionally Abused: Not on file  . Physically Abused: Not on file  . Sexually Abused: Not on file     Review of Systems: Review of Systems  Constitutional: Negative.   HENT: Negative.   Eyes: Negative.   Respiratory: Positive for cough, hemoptysis, sputum production, shortness of breath and wheezing.   Cardiovascular: Positive for chest pain, palpitations, orthopnea, claudication, leg swelling and PND.  Gastrointestinal: Negative.   Genitourinary: Positive for dysuria, flank pain, frequency, hematuria and urgency.  Musculoskeletal: Negative for back pain, falls, joint pain, myalgias and neck pain.  Skin: Negative.   Neurological: Negative.   Endo/Heme/Allergies: Negative.    Psychiatric/Behavioral: Negative.     Vital Signs: Blood pressure 107/81, pulse 94, temperature 98.3 F (36.8 C), temperature source Oral, resp. rate 18, height 5\' 1"  (1.549 m), weight 87.1 kg, SpO2 97 %.  Weight trends: Filed Weights   01/05/20 0500 01/06/20 0500 01/07/20 0500  Weight: 85.7 kg 86.2 kg 87.1 kg    Physical Exam: General: NAD,   Head: Normocephalic,  atraumatic. Moist oral mucosal membranes  Eyes: Anicteric, PERRL  Neck: Supple, trachea midline  Lungs:  Clear to auscultation  Heart: Regular rate and rhythm  Abdomen:  Soft, nontender,   Extremities: No peripheral edema.  Neurologic: Nonfocal, moving all four extremities  Skin: No lesions         Lab results: Basic Metabolic Panel: Recent Labs  Lab 01/04/20 0352 01/04/20 0352 01/05/20 0433 01/06/20 0441 01/07/20 0400  NA 123*   < > 127* 125* 128*  K 4.2   < > 4.0 3.7 3.7  CL 89*   < > 92* 91* 92*  CO2 23   < > 23 23 25   GLUCOSE 91   < > 99 88 103*  BUN 20   < > 20 22 32*  CREATININE 1.35*   < > 1.39* 1.45* 1.53*  CALCIUM 9.0   < > 9.3 9.2 9.1  MG 2.5*  --  2.5* 2.4 2.6*   < > = values in this interval not displayed.    Liver Function Tests: Recent Labs  Lab 01/03/20 1344 01/04/20 0352  AST 22 19  ALT 11 12  ALKPHOS 85 76  BILITOT 1.0 1.2  PROT 8.2* 7.5  ALBUMIN 4.4 3.9   No results for input(s): LIPASE, AMYLASE in the last 168 hours. No results for input(s): AMMONIA in the last 168 hours.  CBC: Recent Labs  Lab 01/03/20 1344 01/03/20 1344 01/04/20 0352 01/04/20 0352 01/05/20 0433 01/06/20 0441 01/07/20 0400  WBC 8.0   < > 7.9   < > 7.1 6.9 8.9  NEUTROABS 5.7  --   --   --   --   --   --   HGB 13.0   < > 12.3   < > 13.1 13.1 12.9  HCT 37.7   < > 34.9*   < > 36.8 37.5 36.7  MCV 92.4  --  91.8  --  91.3 91.2 91.1  PLT 325   < > 310   < > 331 319 334   < > = values in this interval not displayed.    Cardiac Enzymes: Recent Labs  Lab 01/04/20 0352  CKTOTAL 87     BNP: Invalid input(s): POCBNP  CBG: No results for input(s): GLUCAP in the last 168 hours.  Microbiology: Results for orders placed or performed during the hospital encounter of 01/03/20  Respiratory Panel by RT PCR (Flu A&B, Covid) - Nasopharyngeal Swab     Status: None   Collection Time: 01/03/20  4:53 PM   Specimen: Nasopharyngeal Swab  Result Value Ref Range Status   SARS Coronavirus 2 by RT PCR NEGATIVE NEGATIVE Final    Comment: (NOTE) SARS-CoV-2 target nucleic acids are NOT DETECTED.  The SARS-CoV-2 RNA is generally detectable in upper respiratoy specimens during the acute phase of infection. The lowest concentration of SARS-CoV-2 viral copies this assay can detect is 131 copies/mL. A negative result does not preclude SARS-Cov-2 infection and should not be used as the sole basis for treatment or other patient management decisions. A negative result may occur with  improper specimen collection/handling, submission of specimen other than nasopharyngeal swab, presence of viral mutation(s) within the areas targeted by this assay, and inadequate number of viral copies (<131 copies/mL). A negative result must be combined with clinical observations, patient history, and epidemiological information. The expected result is Negative.  Fact Sheet for Patients:  https://www.moore.com/https://www.fda.gov/media/142436/download  Fact Sheet for Healthcare Providers:  https://www.young.biz/https://www.fda.gov/media/142435/download  This test is no t  yet approved or cleared by the [place] and  has been authorized for detection and/or diagnosis of SARS-CoV-2 by FDA under an Emergency Use Authorization (EUA). This EUA will remain  in effect (meaning this test can be used) for the duration of the COVID-19 declaration under Section 564(b)(1) of the Act, 21 U.S.C. section 360bbb-3(b)(1), unless the authorization is terminated or revoked sooner.     Influenza A by PCR NEGATIVE NEGATIVE Final   Influenza B by PCR  NEGATIVE NEGATIVE Final    Comment: (NOTE) The Xpert Xpress SARS-CoV-2/FLU/RSV assay is intended as an aid in  the diagnosis of influenza from Nasopharyngeal swab specimens and  should not be used as a sole basis for treatment. Nasal washings and  aspirates are unacceptable for Xpert Xpress SARS-CoV-2/FLU/RSV  testing.  Fact Sheet for Patients: https://www.moore.com/  Fact Sheet for Healthcare Providers: https://www.young.biz/  This test is not yet approved or cleared by the [place] FDA and  has been authorized for detection and/or diagnosis of SARS-CoV-2 by  FDA under an Emergency Use Authorization (EUA). This EUA will remain  in effect (meaning this test can be used) for the duration of the  Covid-19 declaration under Section 564(b)(1) of the Act, 21  U.S.C. section 360bbb-3(b)(1), unless the authorization is  terminated or revoked. Performed at [facility], [address] [place], [place] [address]     Coagulation Studies: No results for input(s): LABPROT, INR in the last 72 hours.  Urinalysis: No results for input(s): COLORURINE, LABSPEC, PHURINE, GLUCOSEU, HGBUR, BILIRUBINUR, KETONESUR, PROTEINUR, UROBILINOGEN, NITRITE, LEUKOCYTESUR in the last 72 hours.  Invalid input(s): APPERANCEUR    Imaging:  No results found.   Assessment & Plan: Helen Herring is a 79 y.o. black female with hypertension, asthma, arthritis who was admitted to [facility] on 01/03/2020 for Hyponatremia [E87.1] Generalized weakness [R53.1] AKI (acute kidney injury) (HCC) [N17.9] Nonintractable headache, unspecified chronicity pattern, unspecified headache type [R51.9]  1. Hyponatremia: euvolemic on examination. Consistent with SIADH - recommend discontinuation of hydrochlorothiazide on discharge.  - Fluid restriction - Salt tabs   2. Acute kidney injury on Chronic kidney disease stage IIIA: with bland urine. Baseline creatinine of 1.2,  GFR of 53 on 12/22/2019.  Secondary to prerenal azotemia.  Currently holding benazepril.   3. Hypertension: not currently on any medications. Home regimen of benazepril, triamterene and hydrochlorothiazide.     LOS: 4 Tyus Kallam 10/31/20211:32 PM

## 2020-01-11 LAB — METHYLMALONIC ACID, SERUM: Methylmalonic Acid, Quantitative: 335 nmol/L (ref 0–378)

## 2020-05-05 ENCOUNTER — Emergency Department
Admission: EM | Admit: 2020-05-05 | Discharge: 2020-05-05 | Disposition: A | Discharge status: Home / Self Care | Payer: Medicare Other | Attending: Emergency Medicine | Admitting: Emergency Medicine

## 2020-05-05 ENCOUNTER — Other Ambulatory Visit: Payer: Self-pay

## 2020-05-05 DIAGNOSIS — I10 Essential (primary) hypertension: Secondary | ICD-10-CM | POA: Insufficient documentation

## 2020-05-05 DIAGNOSIS — Z79899 Other long term (current) drug therapy: Secondary | ICD-10-CM | POA: Insufficient documentation

## 2020-05-05 DIAGNOSIS — M79605 Pain in left leg: Secondary | ICD-10-CM | POA: Diagnosis not present

## 2020-05-05 DIAGNOSIS — J45901 Unspecified asthma with (acute) exacerbation: Secondary | ICD-10-CM | POA: Insufficient documentation

## 2020-05-05 DIAGNOSIS — G629 Polyneuropathy, unspecified: Secondary | ICD-10-CM

## 2020-05-05 DIAGNOSIS — R070 Pain in throat: Secondary | ICD-10-CM | POA: Insufficient documentation

## 2020-05-05 DIAGNOSIS — M79604 Pain in right leg: Secondary | ICD-10-CM | POA: Diagnosis present

## 2020-05-05 LAB — CBC WITH DIFFERENTIAL/PLATELET
Abs Immature Granulocytes: 0.02 10*3/uL (ref 0.00–0.07)
Basophils Absolute: 0 10*3/uL (ref 0.0–0.1)
Basophils Relative: 1 %
Eosinophils Absolute: 0.1 10*3/uL (ref 0.0–0.5)
Eosinophils Relative: 2 %
HCT: 33.1 % — ABNORMAL LOW (ref 36.0–46.0)
Hemoglobin: 10.8 g/dL — ABNORMAL LOW (ref 12.0–15.0)
Immature Granulocytes: 0 %
Lymphocytes Relative: 26 %
Lymphs Abs: 1.5 10*3/uL (ref 0.7–4.0)
MCH: 31.1 pg (ref 26.0–34.0)
MCHC: 32.6 g/dL (ref 30.0–36.0)
MCV: 95.4 fL (ref 80.0–100.0)
Monocytes Absolute: 0.5 10*3/uL (ref 0.1–1.0)
Monocytes Relative: 8 %
Neutro Abs: 3.9 10*3/uL (ref 1.7–7.7)
Neutrophils Relative %: 63 %
Platelets: 212 10*3/uL (ref 150–400)
RBC: 3.47 MIL/uL — ABNORMAL LOW (ref 3.87–5.11)
RDW: 14.3 % (ref 11.5–15.5)
WBC: 6.1 10*3/uL (ref 4.0–10.5)
nRBC: 0 % (ref 0.0–0.2)

## 2020-05-05 LAB — BASIC METABOLIC PANEL
Anion gap: 6 (ref 5–15)
BUN: 9 mg/dL (ref 8–23)
CO2: 26 mmol/L (ref 22–32)
Calcium: 9.1 mg/dL (ref 8.9–10.3)
Chloride: 107 mmol/L (ref 98–111)
Creatinine, Ser: 1.04 mg/dL — ABNORMAL HIGH (ref 0.44–1.00)
GFR, Estimated: 55 mL/min — ABNORMAL LOW (ref >60)
Glucose, Bld: 105 mg/dL — ABNORMAL HIGH (ref 70–99)
Potassium: 3.5 mmol/L (ref 3.5–5.1)
Sodium: 139 mmol/L (ref 135–145)

## 2020-05-05 LAB — TROPONIN I (HIGH SENSITIVITY): Troponin I (High Sensitivity): 5 ng/L (ref <18)

## 2020-05-05 MED ORDER — HYDROCODONE-ACETAMINOPHEN 5-325 MG PO TABS
1.0000 | ORAL_TABLET | Freq: Once | ORAL | Status: AC
  Administered 2020-05-05: 1 via ORAL
  Filled 2020-05-05: qty 1

## 2020-05-05 MED ORDER — ONDANSETRON 4 MG PO TBDP
4.0000 mg | ORAL_TABLET | Freq: Once | ORAL | Status: AC
  Administered 2020-05-05: 4 mg via ORAL
  Filled 2020-05-05: qty 1

## 2020-05-05 NOTE — ED Provider Notes (Addendum)
[facility] Emergency Department Provider Note  ____________________________________________   Event Date/Time   First MD Initiated Contact with Patient 05/05/20 [phone]     (approximate)  I have reviewed the triage vital signs and the nursing notes.   HISTORY  Chief Complaint Leg Pain    HPI Helen Herring is a 80 y.o. female with history of asthma, hypertension, neuropathy who presents to the emergency department with complaints of burning in both of her legs.  States this has been intermittent and ongoing for months.  Her daughter reports she was on gabapentin 600 mg 3 times a day but this was recently decreased to 300 mg 3 times a day due to recent admission for acute kidney injury.  They state that she took a dose of gabapentin just prior to arrival to the ED.  She is able to ambulate.  No injury to her legs.  No swelling.  She is also concerned because she feels like there is a lump in her throat.  She states that it feels swollen.  She is able to swallow, speak and breathe without difficulty.  No rash.  No lip or tongue swelling.  No chest pain or shortness of breath.  No sick contacts.  No fever.  States that this started yesterday afternoon.        Past Medical History:  Diagnosis Date  . Asthma   . Hypertension     Patient Active Problem List   Diagnosis Date Noted  . AKI (acute kidney injury) (HCC) 01/03/2020  . Generalized weakness 01/03/2020  . Headache 01/03/2020  . Peripheral neuropathy 01/03/2020  . Acute hyponatremia 07/12/2019  . Benign essential HTN 07/12/2019  . Asthma, chronic, unspecified asthma severity, with acute exacerbation 07/12/2019    Past Surgical History:  Procedure Laterality Date  . ANTERIOR VITRECTOMY Right 01/20/2018   Procedure: ANTERIOR VITRECTOMY;  Surgeon: Elliot Cousin, MD;  Location: [facility] ORS;  Service: Ophthalmology;  Laterality: Right;  . CATARACT EXTRACTION W/PHACO Right 01/20/2018   Procedure:  CATARACT EXTRACTION PHACO AND INTRAOCULAR LENS PLACEMENT (IOC);  Surgeon: Elliot Cousin, MD;  Location: [facility] ORS;  Service: Ophthalmology;  Laterality: Right;  Lot# [id number] H [place]: 01:49.4 CDE: 21.64  . CHOLECYSTECTOMY      Prior to Admission medications   Medication Sig Start Date End Date Taking? Authorizing Provider  benazepril (LOTENSIN) 20 MG tablet Hold until follow up with kidney doctor. 01/07/20   Darlin Priestly, MD  dorzolamide-timolol (COSOPT) 22.3-6.8 MG/ML ophthalmic solution Place 1 drop into both eyes in the morning and at bedtime. 06/19/19   [provider]  gabapentin (NEURONTIN) 600 MG tablet Take 0.5 tablets (300 mg total) by mouth 3 (three) times daily. 01/07/20   Darlin Priestly, MD  travoprost, benzalkonium, (TRAVATAN) 0.004 % ophthalmic solution Place 1 drop into both eyes at bedtime.    [provider]    Allergies Patient has no known allergies.  History reviewed. No pertinent family history.  Social History Social History   Tobacco Use  . Smoking status: Never Smoker  . Smokeless tobacco: Never Used  Vaping Use  . Vaping Use: Never used  Substance Use Topics  . Alcohol use: Never  . Drug use: Never    Review of Systems Constitutional: No fever. Eyes: No visual changes. ENT: No sore throat. Cardiovascular: Denies chest pain. Respiratory: Denies shortness of breath. Gastrointestinal: No nausea, vomiting, diarrhea. Genitourinary: Negative for dysuria. Musculoskeletal: Negative for back pain. Skin: Negative for rash. Neurological: Negative for focal weakness  or numbness.  ____________________________________________   PHYSICAL EXAM:  VITAL SIGNS: ED Triage Vitals  Enc Vitals Group     BP 05/05/20 0457 (!) 198/75     Pulse Rate 05/05/20 0457 72     Resp 05/05/20 0457 16     Temp 05/05/20 0457 97.6 F (36.4 C)     Temp Source 05/05/20 0457 Oral     SpO2 05/05/20 0457 99 %     Weight 05/05/20 0502 187 lb (84.8 kg)     Height 05/05/20  0502 5\' 3"  (1.6 m)     Head Circumference --      Peak Flow --      Pain Score 05/05/20 0641 4     Pain Loc --      Pain Edu? --      Excl. in GC? --    CONSTITUTIONAL: Alert and oriented and responds appropriately to questions. Well-appearing; well-nourished HEAD: Normocephalic EYES: Conjunctivae clear, pupils appear equal, EOM appear intact ENT: normal nose; moist mucous membranes; posterior oropharynx is patent without swelling, redness.  There is no tonsillar hypertrophy, exudate.  No uvular deviation.  No trismus, stridor, drooling.  Normal phonation.  No angioedema, facial swelling, redness or warmth. NECK: Supple, normal ROM, no cervical lymphadenopathy, no thyromegaly CARD: RRR; S1 and S2 appreciated; no murmurs, no clicks, no rubs, no gallops RESP: Normal chest excursion without splinting or tachypnea; breath sounds clear and equal bilaterally; no wheezes, no rhonchi, no rales, no hypoxia or respiratory distress, speaking full sentences ABD/GI: Normal bowel sounds; non-distended; soft, non-tender, no rebound, no guarding, no peritoneal signs, no hepatosplenomegaly BACK: The back appears normal EXT: Normal ROM in all joints; no deformity noted, no edema; no cyanosis, no calf tenderness or calf swelling, compartments of the lower extremities are soft, no redness or warmth noted to the lower extremities or rash or other lesions present, no joint effusions in the lower extremities, no bony tenderness or deformity, 2+ DP pulses bilaterally SKIN: Normal color for age and race; warm; no rash on exposed skin NEURO: Moves all extremities equally normal sensation in bilateral lower extremities, normal gait, normal speech PSYCH: The patient's mood and manner are appropriate.  ____________________________________________   LABS (all labs ordered are listed, but only abnormal results are displayed)  Labs Reviewed  CBC WITH DIFFERENTIAL/PLATELET - Abnormal; Notable for the following  components:      Result Value   RBC 3.47 (*)    Hemoglobin 10.8 (*)    HCT 33.1 (*)    All other components within normal limits  BASIC METABOLIC PANEL - Abnormal; Notable for the following components:   Glucose, Bld 105 (*)    Creatinine, Ser 1.04 (*)    GFR, Estimated 55 (*)    All other components within normal limits  TROPONIN I (HIGH SENSITIVITY)   ____________________________________________  EKG   EKG Interpretation  Date/Time:  Sunday May 05 2020 06:09:34 EST Ventricular Rate:  58 PR Interval:    QRS Duration: 93 QT Interval:  418 QTC Calculation: 411 R Axis:   66 Text Interpretation: Sinus rhythm Prolonged PR interval Abnormal R-wave progression, early transition Baseline wander in lead(s) V6 No significant change since last tracing Confirmed by 07-16-2001 [phone]) on 05/05/2020 6:12:59 AM       ____________________________________________  RADIOLOGY 05/07/2020 Ward, personally viewed and evaluated these images (plain radiographs) as part of my medical decision making, as well as reviewing the written report by the radiologist.  ED MD interpretation:  none  Official radiology report(s): No results found.  ____________________________________________   PROCEDURES  Procedure(s) performed (including Critical Care):  Procedures  ____________________________________________   INITIAL IMPRESSION / ASSESSMENT AND PLAN / ED COURSE  As part of my medical decision making, I reviewed the following data within the electronic MEDICAL RECORD NUMBER History obtained from family, Nursing notes reviewed and incorporated, Labs reviewed , EKG interpreted NSR and Notes from prior ED visits         Patient here with complaints of pain from her neuropathy.  Describes it as a burning pain.  Did take gabapentin prior to arrival.  Family is concerned that this is likely due to a change in her regimen.  She has no signs of compartment syndrome, cellulitis, gout, septic  arthritis, DVT, arterial obstruction, fracture on exam.  Given pain medication here and she states she is feeling better.  She is ambulatory.  I do not feel she needs emergent imaging at this time.  As for her feeling of the lump in her throat with swallowing, discussed with family that this could be beginning of viral pharyngitis.  She has no signs of tonsillitis on exam, deep space neck infection, PTA, uvulitis.  Her airway is patent.  There is no angioedema.  She is able to swallow her secretions without difficulty.  No difficulty breathing.  Low suspicion that this is her anginal equivalent but did obtain labs that are reassuring today.  Troponin normal.  I do not feel she needs a repeat troponin given symptoms have been ongoing since yesterday afternoon.  EKG reassuring without ischemia.  I do not feel she needs to be on antibiotics for her throat pain.  Doubt strep pharyngitis per CENTOR score of 0.  I feel patient is safe to be discharged home.  Low suspicion for life-threatening process today.  Recommended close follow-up with her primary care provider.  Patient and daughter comfortable with this plan.  At this time, I do not feel there is any life-threatening condition present. I have reviewed, interpreted and discussed all results (EKG, imaging, lab, urine as appropriate) and exam findings with patient/family. I have reviewed nursing notes and appropriate previous records.  I feel the patient is safe to be discharged home without further emergent workup and can continue workup as an outpatient as needed. Discussed usual and customary return precautions. Patient/family verbalize understanding and are comfortable with this plan.  Outpatient follow-up has been provided as needed. All questions have been answered.  ____________________________________________   FINAL CLINICAL IMPRESSION(S) / ED DIAGNOSES  Final diagnoses:  Peripheral polyneuropathy  Throat pain     ED Discharge Orders     None      *Please note:  Flavia ShipperHazelene W Kiss was evaluated in Emergency Department on 05/05/2020 for the symptoms described in the history of present illness. She was evaluated in the context of the global COVID-19 pandemic, which necessitated consideration that the patient might be at risk for infection with the SARS-CoV-2 virus that causes COVID-19. Institutional protocols and algorithms that pertain to the evaluation of patients at risk for COVID-19 are in a state of rapid change based on information released by regulatory bodies including the CDC and federal and state organizations. These policies and algorithms were followed during the patient's care in the ED.  Some ED evaluations and interventions may be delayed as a result of limited staffing during and the pandemic.*   Note:  This document was prepared using Dragon voice recognition software and may include unintentional  dictation errors.       Ward, Layla Maw, DO 05/05/20 [phone]

## 2020-05-05 NOTE — ED Triage Notes (Signed)
Pt presents for c/o bilateral lower leg "burning", dizziness. Pt also reports difficulty swallowing. Symptoms present x 1 day.  Denies other c/o.

## 2020-05-05 NOTE — Discharge Instructions (Addendum)
You may take over the counter Tylenol 1000 mg every 6 hours as needed for pain.  Please follow-up with your primary care physician to determine if you should increase your gabapentin for your peripheral neuropathy.  Your labs showed no acute abnormalities today.  No sign of heart attack on labs or EKG.  No sign of throat infection today such as strep throat, tonsillitis.  Your airway was open with no sign of swelling today.

## 2020-11-09 IMAGING — CT CT HEAD W/O CM
3 series · 15 of 46 positions shown, 18 images · non-contrast
Comparison: None.

CLINICAL DATA: Altered mental status

EXAM:
CT HEAD WITHOUT CONTRAST
TECHNIQUE: Contiguous axial images were obtained from the base of the skull
through the vertex without intravenous contrast.

[Series 2: head wo · axial · 0.47mm/px · z∈[+590,+710]mm · 9 of 29 slices shown, 12 images]
[im 3/29  brain]
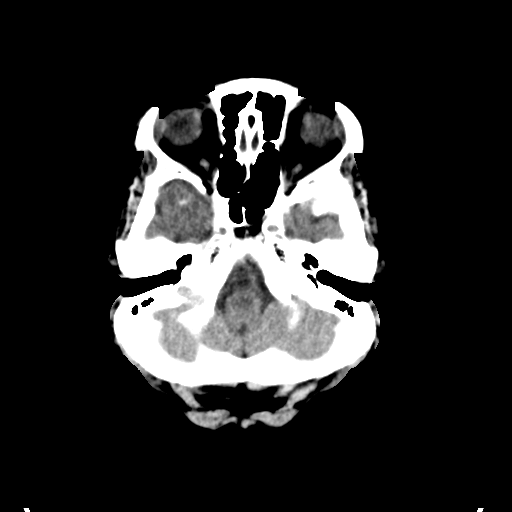
[im 3/29  bone]
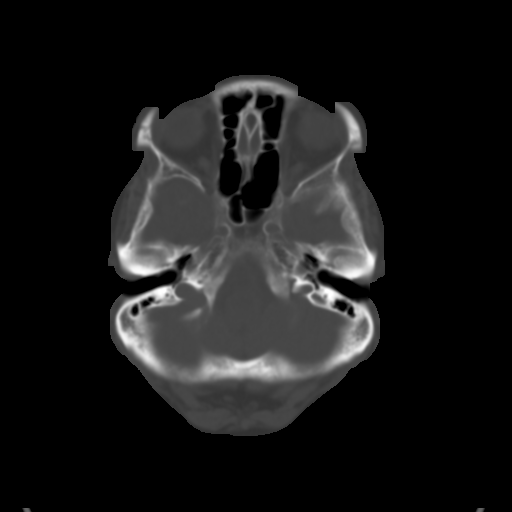
[im 6/29  brain]
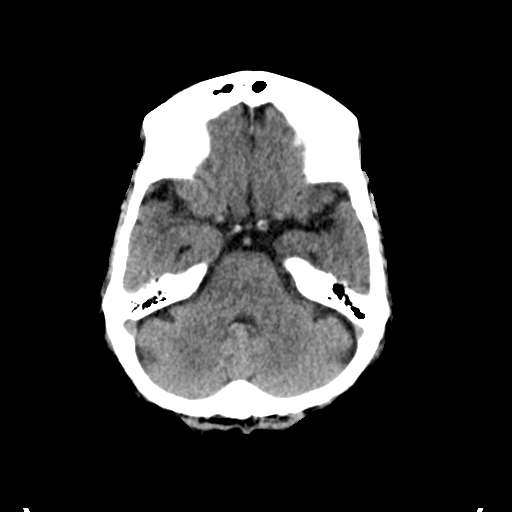
[im 9/29  brain]
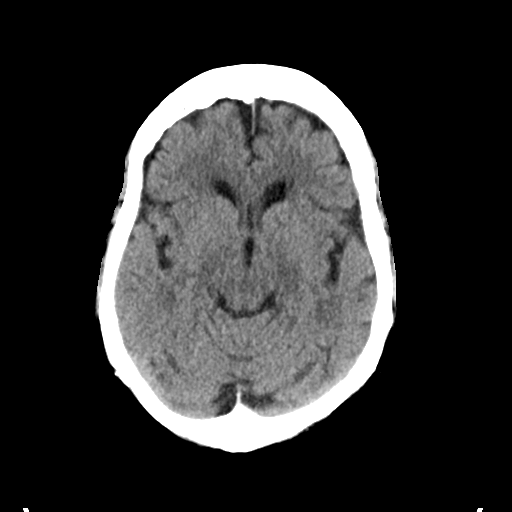
[im 12/29  brain]
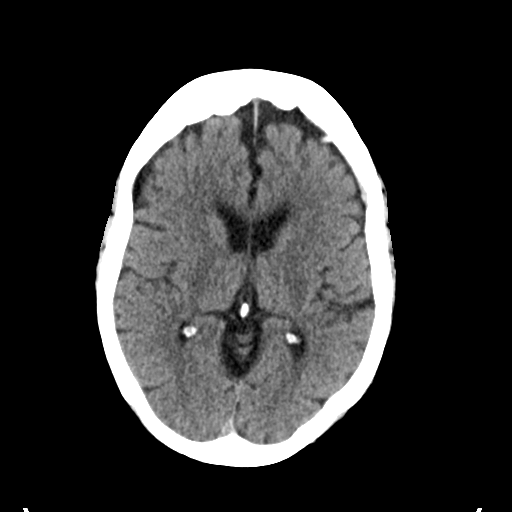
[im 15/29  brain]
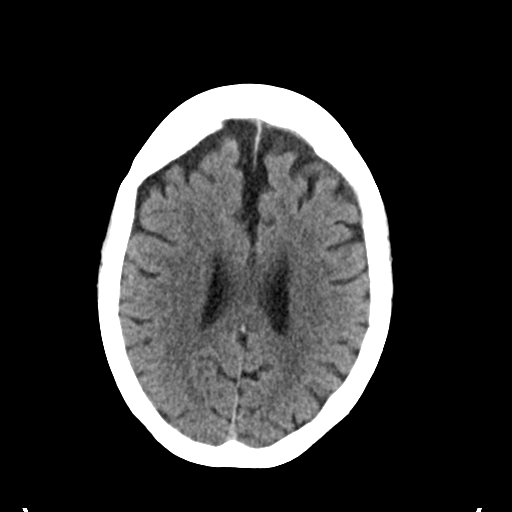
[im 15/29  bone]
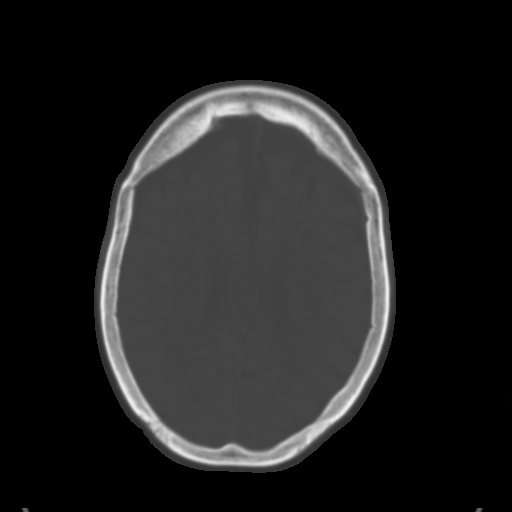
[im 18/29  brain]
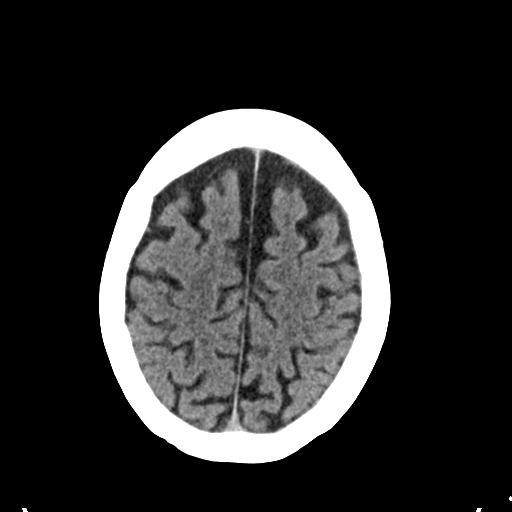
[im 21/29  brain]
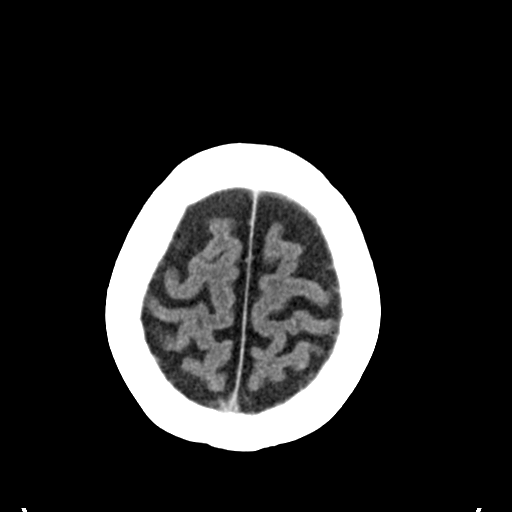
[im 24/29  brain]
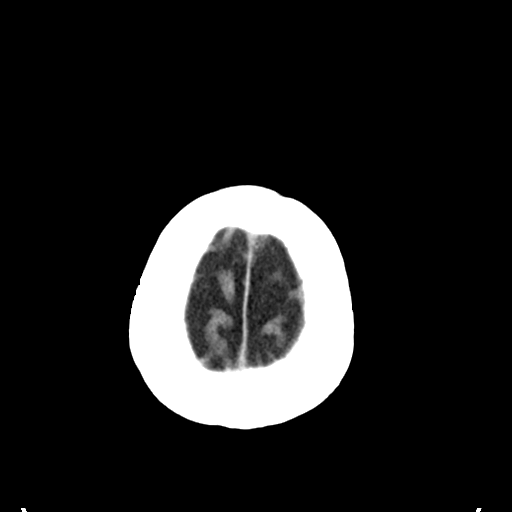
[im 27/29  brain]
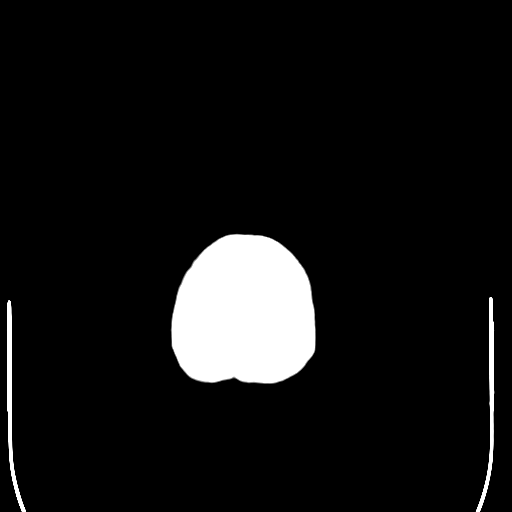
[im 27/29  bone]
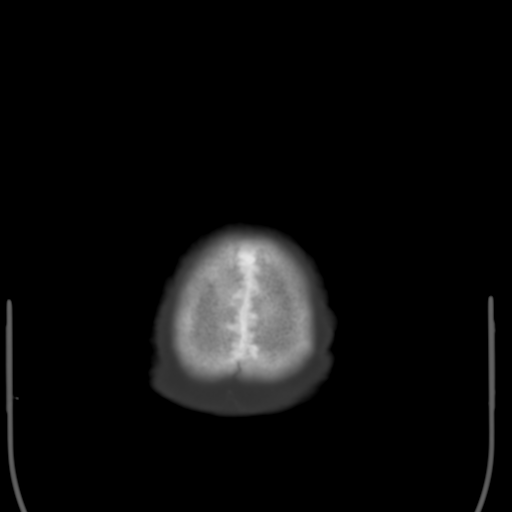

[Series 4: coronal soft tissue · coronal · 0.29mm/px · 3 of 65 slices shown]
[im 22/65  brain]
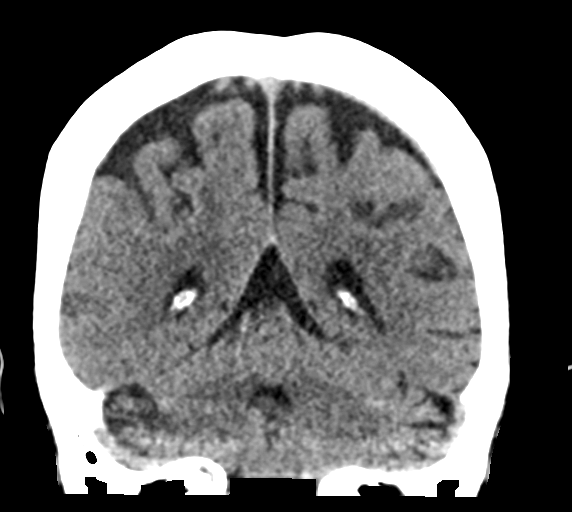
[im 29/65  brain]
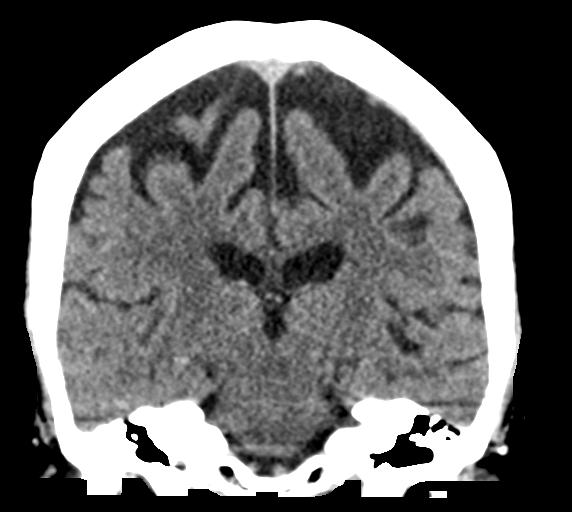
[im 36/65  brain]
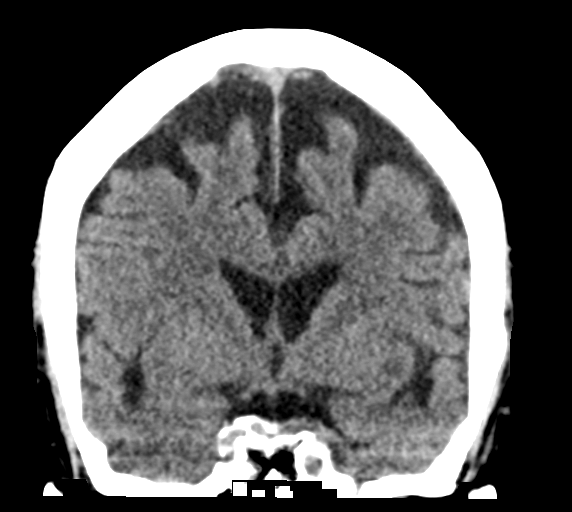

[Series 5: sagittal soft tissue · sagittal · 0.29mm/px · 3 of 51 slices shown]
[im 17/51  brain]
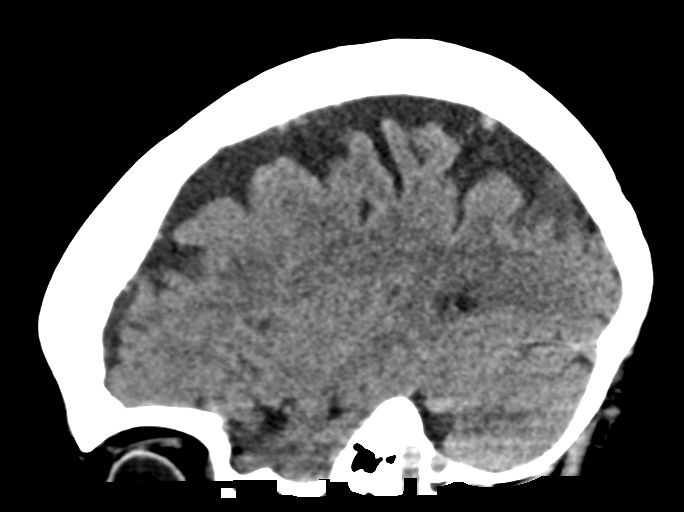
[im 26/51  brain]
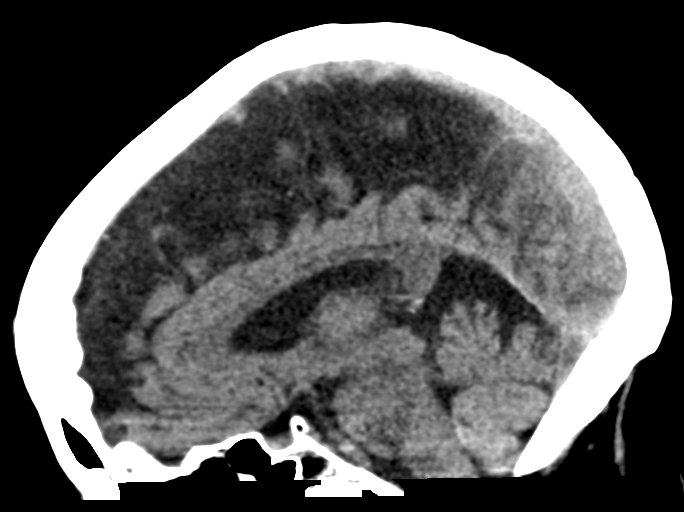
[im 34/51  brain]
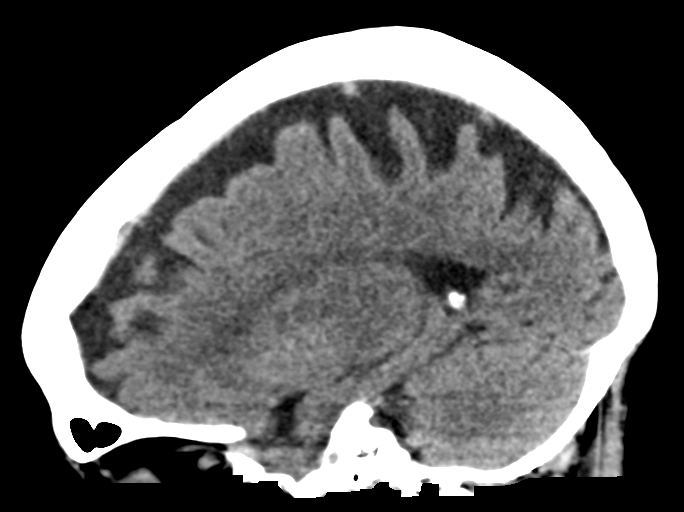

[15 of 46 positions shown; findings below may reference images not displayed]

FINDINGS: Brain: No evidence of acute infarction, hemorrhage, hydrocephalus,
extra-axial collection or intra-axial mass lesion/mass effect.
Incidental small calcified meningioma about the left frontal pole.
Periventricular white matter hypodensity.

Vascular: No hyperdense vessel or unexpected calcification.

Skull: Normal. Negative for fracture or focal lesion.

Sinuses/Orbits: No acute finding.

Other: None.
IMPRESSION: No acute intracranial pathology.  Small-vessel white matter disease.

## 2022-02-02 ENCOUNTER — Other Ambulatory Visit: Payer: Self-pay

## 2022-02-02 ENCOUNTER — Emergency Department: Payer: Medicare Other

## 2022-02-02 DIAGNOSIS — R531 Weakness: Secondary | ICD-10-CM | POA: Insufficient documentation

## 2022-02-02 DIAGNOSIS — R410 Disorientation, unspecified: Secondary | ICD-10-CM | POA: Insufficient documentation

## 2022-02-02 DIAGNOSIS — I129 Hypertensive chronic kidney disease with stage 1 through stage 4 chronic kidney disease, or unspecified chronic kidney disease: Secondary | ICD-10-CM | POA: Diagnosis not present

## 2022-02-02 DIAGNOSIS — Z79899 Other long term (current) drug therapy: Secondary | ICD-10-CM | POA: Insufficient documentation

## 2022-02-02 DIAGNOSIS — J45909 Unspecified asthma, uncomplicated: Secondary | ICD-10-CM | POA: Diagnosis not present

## 2022-02-02 DIAGNOSIS — N189 Chronic kidney disease, unspecified: Secondary | ICD-10-CM | POA: Diagnosis not present

## 2022-02-02 DIAGNOSIS — R627 Adult failure to thrive: Secondary | ICD-10-CM | POA: Diagnosis not present

## 2022-02-02 DIAGNOSIS — R630 Anorexia: Secondary | ICD-10-CM | POA: Diagnosis not present

## 2022-02-02 DIAGNOSIS — N3001 Acute cystitis with hematuria: Secondary | ICD-10-CM | POA: Diagnosis not present

## 2022-02-02 LAB — COMPREHENSIVE METABOLIC PANEL
ALT: 12 U/L (ref 0–44)
AST: 19 U/L (ref 15–41)
Albumin: 3.6 g/dL (ref 3.5–5.0)
Alkaline Phosphatase: 53 U/L (ref 38–126)
Anion gap: 7 (ref 5–15)
BUN: 12 mg/dL (ref 8–23)
CO2: 27 mmol/L (ref 22–32)
Calcium: 9.1 mg/dL (ref 8.9–10.3)
Chloride: 101 mmol/L (ref 98–111)
Creatinine, Ser: 0.94 mg/dL (ref 0.44–1.00)
GFR, Estimated: >60 mL/min (ref >60)
Glucose, Bld: 101 mg/dL — ABNORMAL HIGH (ref 70–99)
Potassium: 4 mmol/L (ref 3.5–5.1)
Sodium: 135 mmol/L (ref 135–145)
Total Bilirubin: 0.5 mg/dL (ref 0.3–1.2)
Total Protein: 7.1 g/dL (ref 6.5–8.1)

## 2022-02-02 LAB — CBC WITH DIFFERENTIAL/PLATELET
Abs Immature Granulocytes: 0.01 10*3/uL (ref 0.00–0.07)
Basophils Absolute: 0.1 10*3/uL (ref 0.0–0.1)
Basophils Relative: 1 %
Eosinophils Absolute: 0.1 10*3/uL (ref 0.0–0.5)
Eosinophils Relative: 2 %
HCT: 34.5 % — ABNORMAL LOW (ref 36.0–46.0)
Hemoglobin: 11.8 g/dL — ABNORMAL LOW (ref 12.0–15.0)
Immature Granulocytes: 0 %
Lymphocytes Relative: 39 %
Lymphs Abs: 1.9 10*3/uL (ref 0.7–4.0)
MCH: 31.7 pg (ref 26.0–34.0)
MCHC: 34.2 g/dL (ref 30.0–36.0)
MCV: 92.7 fL (ref 80.0–100.0)
Monocytes Absolute: 0.7 10*3/uL (ref 0.1–1.0)
Monocytes Relative: 15 %
Neutro Abs: 2.1 10*3/uL (ref 1.7–7.7)
Neutrophils Relative %: 43 %
Platelets: 225 10*3/uL (ref 150–400)
RBC: 3.72 MIL/uL — ABNORMAL LOW (ref 3.87–5.11)
RDW: 13.1 % (ref 11.5–15.5)
WBC: 4.8 10*3/uL (ref 4.0–10.5)
nRBC: 0 % (ref 0.0–0.2)

## 2022-02-02 LAB — URINALYSIS, ROUTINE W REFLEX MICROSCOPIC
Bilirubin Urine: NEGATIVE
Glucose, UA: NEGATIVE mg/dL
Hgb urine dipstick: NEGATIVE
Ketones, ur: NEGATIVE mg/dL
Nitrite: NEGATIVE
Protein, ur: NEGATIVE mg/dL
Specific Gravity, Urine: 1.021 (ref 1.005–1.030)
pH: 5 (ref 5.0–8.0)

## 2022-02-02 LAB — LACTIC ACID, PLASMA: Lactic Acid, Venous: 1.2 mmol/L (ref 0.5–1.9)

## 2022-02-02 NOTE — ED Triage Notes (Signed)
Daughter reports patient has been having dizziness weakness and no appetite.  Daughter reports confusion.  Patient doesn't know month or year. Patient unable to tell me her daughters name.

## 2022-02-02 NOTE — ED Provider Notes (Signed)
Emergency Medicine Provider Triage Evaluation Note  Helen Herring , a 81 y.o. female with a history of asthma and hypertension was evaluated in triage.  Pt complains of progressive weakness over the past several months with unintentional weight loss.  Patient's daughter reports that patient seems increasingly confused and has not been able to tell her the date and year over the past several days.  No complaints of chest pain or abdominal pain.  Review of Systems  Positive: Patient has weakness and confusion.  Negative: No chest pain or abdominal pain.   Physical Exam  BP (!) 160/88 (BP Location: Left Arm)   Pulse 77   Temp 98.8 F (37.1 C) (Oral)   Resp 16   Ht 5\' 3"  (1.6 m)   Wt 59.9 kg   LMP  (LMP Unknown)   SpO2 99%   BMI 23.38 kg/m  Gen:   Awake, no distress   Resp:  Normal effort  MSK:   Moves extremities without difficulty  Other:    Medical Decision Making  Medically screening exam initiated at 4:27 PM.  Appropriate orders placed.  Helen Herring was informed that the remainder of the evaluation will be completed by another provider, this initial triage assessment does not replace that evaluation, and the importance of remaining in the ED until their evaluation is complete.     Cyndie Mull [place], PA-C 02/02/22 1628    02/04/22, MD 02/04/22 [phone]

## 2022-02-03 ENCOUNTER — Emergency Department
Admission: EM | Admit: 2022-02-03 | Discharge: 2022-02-03 | Disposition: A | Discharge status: Home / Self Care | Payer: Medicare Other | Attending: Emergency Medicine | Admitting: Emergency Medicine

## 2022-02-03 DIAGNOSIS — R410 Disorientation, unspecified: Secondary | ICD-10-CM

## 2022-02-03 DIAGNOSIS — R627 Adult failure to thrive: Secondary | ICD-10-CM

## 2022-02-03 DIAGNOSIS — N3001 Acute cystitis with hematuria: Secondary | ICD-10-CM

## 2022-02-03 LAB — TSH: TSH: 1.962 u[IU]/mL (ref 0.350–4.500)

## 2022-02-03 LAB — TROPONIN I (HIGH SENSITIVITY): Troponin I (High Sensitivity): 7 ng/L (ref <18)

## 2022-02-03 MED ORDER — SODIUM CHLORIDE 0.9 % IV BOLUS (SEPSIS)
1000.0000 mL | Freq: Once | INTRAVENOUS | Status: AC
  Administered 2022-02-03: 1000 mL via INTRAVENOUS

## 2022-02-03 NOTE — ED Provider Notes (Signed)
[facility] Provider Note    Event Date/Time   First MD Initiated Contact with Patient 02/03/22 0009     (approximate)   History   Dizziness and Weakness   HPI  Helen Herring is a 81 y.o. female hypertension, chronic kidney disease, B12 deficiency, asthma who presents emergency department her daughter for 3 to 4 months of generalized weakness, decreased oral intake, failure to thrive and confusion.  Patient reports her mother lives alone.  There has not been any acute change.  No fevers, cough, vomiting, diarrhea, falls.  No diagnosis of dementia.  Daughter was concerned that she could be dehydrated.   History provided by patient's daughter, level 5 caveat secondary to possible dementia.    Past Medical History:  Diagnosis Date   Asthma    Hypertension     Past Surgical History:  Procedure Laterality Date   ANTERIOR VITRECTOMY Right 01/20/2018   Procedure: ANTERIOR VITRECTOMY;  Surgeon: Elliot Cousin, MD;  Location: [facility] ORS;  Service: Ophthalmology;  Laterality: Right;   CATARACT EXTRACTION W/PHACO Right 01/20/2018   Procedure: CATARACT EXTRACTION PHACO AND INTRAOCULAR LENS PLACEMENT (IOC);  Surgeon: Elliot Cousin, MD;  Location: [facility] ORS;  Service: Ophthalmology;  Laterality: Right;  Lot# [id number] H [place]: 01:49.4 CDE: 21.64   CHOLECYSTECTOMY      MEDICATIONS:  Prior to Admission medications   Medication Sig Start Date End Date Taking? Authorizing Provider  benazepril (LOTENSIN) 20 MG tablet Hold until follow up with kidney doctor. 01/07/20   Darlin Priestly, MD  dorzolamide-timolol (COSOPT) 22.3-6.8 MG/ML ophthalmic solution Place 1 drop into both eyes in the morning and at bedtime. 06/19/19   [provider]  gabapentin (NEURONTIN) 600 MG tablet Take 0.5 tablets (300 mg total) by mouth 3 (three) times daily. 01/07/20   Darlin Priestly, MD  travoprost, benzalkonium, (TRAVATAN) 0.004 % ophthalmic solution Place 1 drop into both eyes at bedtime.     [provider]    Physical Exam   Triage Vital Signs: ED Triage Vitals [02/02/22 1623]  Enc Vitals Group     BP (!) 160/88     Pulse Rate 77     Resp 16     Temp 98.8 F (37.1 C)     Temp Source Oral     SpO2 99 %     Weight 132 lb (59.9 kg)     Height 5\' 3"  (1.6 m)     Head Circumference      Peak Flow      Pain Score 0     Pain Loc      Pain Edu?      Excl. in GC?     Most recent vital signs: Vitals:   02/03/22 0115 02/03/22 0244  BP: (!) 161/91 138/89  Pulse: 85 77  Resp:  16  Temp:  98 F (36.7 C)  SpO2:  99%    CONSTITUTIONAL: Alert and oriented to person only but not place or time or situation.  Elderly.  In no distress. HEAD: Normocephalic, atraumatic EYES: Conjunctivae clear, pupils appear equal, sclera nonicteric ENT: normal nose; moist mucous membranes NECK: Supple, normal ROM CARD: RRR; S1 and S2 appreciated; no murmurs, no clicks, no rubs, no gallops RESP: Normal chest excursion without splinting or tachypnea; breath sounds clear and equal bilaterally; no wheezes, no rhonchi, no rales, no hypoxia or respiratory distress, speaking full sentences ABD/GI: Normal bowel sounds; non-distended; soft, non-tender, no rebound, no guarding, no peritoneal signs BACK: The back appears  normal EXT: Normal ROM in all joints; no deformity noted, no edema; no cyanosis SKIN: Normal color for age and race; warm; no rash on exposed skin NEURO: Moves all extremities equally, normal speech, cranial nerves II through XII intact, normal sensation, ambulates with steady gait PSYCH: The patient's mood and manner are appropriate.   ED Results / Procedures / Treatments   LABS: (all labs ordered are listed, but only abnormal results are displayed) Labs Reviewed  URINALYSIS, ROUTINE W REFLEX MICROSCOPIC - Abnormal; Notable for the following components:      Result Value   Color, Urine YELLOW (*)    APPearance HAZY (*)    Leukocytes,Ua MODERATE (*)    Bacteria, UA  RARE (*)    All other components within normal limits  CBC WITH DIFFERENTIAL/PLATELET - Abnormal; Notable for the following components:   RBC 3.72 (*)    Hemoglobin 11.8 (*)    HCT 34.5 (*)    All other components within normal limits  COMPREHENSIVE METABOLIC PANEL - Abnormal; Notable for the following components:   Glucose, Bld 101 (*)    All other components within normal limits  URINE CULTURE  LACTIC ACID, PLASMA  TSH  TROPONIN I (HIGH SENSITIVITY)     EKG:  EKG Interpretation  Date/Time:  Monday February 02 2022 16:33:39 EST Ventricular Rate:  74 PR Interval:  272 QRS Duration: 72 QT Interval:  364 QTC Calculation: 404 R Axis:   60 Text Interpretation: Sinus rhythm with 1st degree A-V block with Premature atrial complexes Nonspecific ST and T wave abnormality Abnormal ECG When compared with ECG of 05-May-2020 06:09, PREVIOUS ECG IS PRESENT Confirmed by Rochele Raring [phone]) on 02/03/2022 12:22:57 AM         RADIOLOGY: My personal review and interpretation of imaging: CT head unremarkable.  I have personally reviewed all radiology reports.   CT Head Wo Contrast  Result Date: 02/02/2022 CLINICAL DATA:  Dizziness weakness EXAM: CT HEAD WITHOUT CONTRAST TECHNIQUE: Contiguous axial images were obtained from the base of the skull through the vertex without intravenous contrast. RADIATION DOSE REDUCTION: This exam was performed according to the departmental dose-optimization program which includes automated exposure control, adjustment of the mA and/or kV according to patient size and/or use of iterative reconstruction technique. COMPARISON:  CT brain 01/03/2020 FINDINGS: Brain: No acute territorial infarction, hemorrhage or intracranial mass. Atrophy and mild chronic small vessel ischemic changes of the white matter. Stable ventricle size. Chronic dural thickening overlying the left convexity, series 2, image 13. Vascular: No hyperdense vessels.  Carotid vascular calcification  Skull: Normal. Negative for fracture or focal lesion. Sinuses/Orbits: No acute finding. Other: None IMPRESSION: 1. No CT evidence for acute intracranial abnormality. 2. Atrophy and chronic small vessel ischemic changes of the white matter. Stable mild chronic dural thickening along the left anterior convexity. Electronically Signed   By: Jasmine Pang M.D.   On: 02/02/2022 17:37     PROCEDURES:  Critical Care performed: No     .1-3 Lead EKG Interpretation  Performed by: Sarah Baez, Layla Maw, DO Authorized by: Kloee Ballew, Layla Maw, DO     Interpretation: normal     ECG rate:  77   ECG rate assessment: normal     Rhythm: sinus rhythm     Ectopy: none     Conduction: normal       IMPRESSION / MDM / ASSESSMENT AND PLAN / ED COURSE  I reviewed the triage vital signs and the nursing notes.    Patient  here with concerns for declining health and memory that has been ongoing for several months.  The patient is on the cardiac monitor to evaluate for evidence of arrhythmia and/or significant heart rate changes.   DIFFERENTIAL DIAGNOSIS (includes but not limited to):   Dementia, UTI, dehydration, less likely ACS, symptomatic anemia, electrolyte derangement, thyroid dysfunction, stroke   Patient's presentation is most consistent with acute presentation with potential threat to life or bodily function.   PLAN: Workup initiated from triage.  Patient has improving anemia.  No leukocytosis.  Normal electrolytes, glucose, renal function.  Urine shows some white blood cells, red blood cells and bacteria also many squamous cells.  Will add on culture.  I suspect this is more likely a contaminated sample than a UTI.  I do not think this is the cause of her months long failure to thrive, confusion.  I suspect that she has underlying dementia that is not yet been diagnosed.  Will check orthostatic vital signs and give IV fluids given daughter's concern for decreased oral intake and dehydration although  clinically she does not appear significantly dehydrated here and is hemodynamically stable.  Will encourage oral fluids.  CT head reviewed and interpreted by myself and the radiologist and that shows no acute abnormality, no stroke.  She has no focal neurologic deficits on exam today.  Will ambulate patient here.   MEDICATIONS GIVEN IN ED: Medications  sodium chloride 0.9 % bolus 1,000 mL (0 mLs Intravenous Stopped 02/03/22 0200)     ED COURSE:    Orthostatics are: Lying down P 67 BP 166/64  Sitting P 69 BP 184/90     Standing P 85 BP 161/91   Patient's heart rate does go up with standing from lying down.  We did give her a liter of fluids.  Her pressures have been stable.  She has been able to eat and drink and ambulate without difficulty and has a steady gait and does not require assistance.  Her TSH today is unremarkable.  Her troponin is negative.  No evidence seen on cardiac monitoring.  Discussed at length with daughter that patient does not meet criteria for medical admission at this time as a suspect that this is more of a chronic progressive process.  We discussed the possibility of boarding in the ED for social work evaluation and placement but explained to daughter that that can take days, weeks and the ED is not an ideal place for people to be as they do not get to get up and walk, go outside, perform normal activities, shower and are exposed to hospital infections.  Daughter verbalized understanding and states she would prefer to take the patient home and follow-up with their PCP to talk about placement as an outpatient.  I feel this is extremely reasonable.  Discussed supportive care instructions and return precautions.   At this time, I do not feel there is any life-threatening condition present. I reviewed all nursing notes, vitals, pertinent previous records.  All lab and urine results, EKGs, imaging ordered have been independently reviewed and interpreted by myself.  I reviewed all  available radiology reports from any imaging ordered this visit.  Based on my assessment, I feel the patient is safe to be discharged home without further emergent workup and can continue workup as an outpatient as needed. Discussed all findings, treatment plan as well as usual and customary return precautions.  They verbalize understanding and are comfortable with this plan.  Outpatient follow-up has been provided as  needed.  All questions have been answered.   CONSULTS: Admission considered but no criteria for medical admission and symptoms seem to be chronic in nature.   OUTSIDE RECORDS REVIEWED: Reviewed multiple PCP notes over the past 3 months.  It appears she is being followed very closely by Dr. Letitia LibraJohnston.       FINAL CLINICAL IMPRESSION(S) / ED DIAGNOSES   Final diagnoses:  Chronic confusion  FTT (failure to thrive) in adult  Acute cystitis with hematuria     Rx / DC Orders   ED Discharge Orders     None        Note:  This document was prepared using Dragon voice recognition software and may include unintentional dictation errors.   Geraldo Haris, Layla MawKristen N, DO 02/03/22 [phone]

## 2022-02-03 NOTE — Discharge Instructions (Addendum)
Your mother's lab work, urine, CT head and EKG today were reassuring.  We have no signs of an acute emergent problem to admit her to the hospital for.  I suspect that she has underlying dementia and given this has been going on for months, I recommend follow-up with your outpatient primary care doctor to discuss further options such as medications or possible placement into a nursing facility.

## 2022-02-04 LAB — URINE CULTURE

## 2022-05-27 ENCOUNTER — Other Ambulatory Visit: Payer: Self-pay | Admitting: Physician Assistant

## 2022-05-27 DIAGNOSIS — R2689 Other abnormalities of gait and mobility: Secondary | ICD-10-CM

## 2022-05-27 DIAGNOSIS — R413 Other amnesia: Secondary | ICD-10-CM

## 2022-06-05 ENCOUNTER — Ambulatory Visit
Admission: RE | Admit: 2022-06-05 | Discharge: 2022-06-05 | Disposition: A | Discharge status: Home / Self Care | Payer: 59 | Source: Ambulatory Visit | Attending: Physician Assistant | Admitting: Physician Assistant

## 2022-06-05 DIAGNOSIS — R413 Other amnesia: Secondary | ICD-10-CM | POA: Insufficient documentation

## 2022-06-05 DIAGNOSIS — R2689 Other abnormalities of gait and mobility: Secondary | ICD-10-CM | POA: Insufficient documentation

## 2023-08-21 ENCOUNTER — Inpatient Hospital Stay
Admission: EM | Admit: 2023-08-21 | Discharge: 2023-08-26 | DRG: 378 | Disposition: A | Discharge status: Home / Self Care | Source: Skilled Nursing Facility | Attending: Internal Medicine | Admitting: Internal Medicine

## 2023-08-21 ENCOUNTER — Other Ambulatory Visit: Payer: Self-pay

## 2023-08-21 ENCOUNTER — Emergency Department

## 2023-08-21 DIAGNOSIS — I129 Hypertensive chronic kidney disease with stage 1 through stage 4 chronic kidney disease, or unspecified chronic kidney disease: Secondary | ICD-10-CM | POA: Diagnosis present

## 2023-08-21 DIAGNOSIS — E538 Deficiency of other specified B group vitamins: Secondary | ICD-10-CM | POA: Diagnosis present

## 2023-08-21 DIAGNOSIS — R7989 Other specified abnormal findings of blood chemistry: Secondary | ICD-10-CM

## 2023-08-21 DIAGNOSIS — F0390 Unspecified dementia without behavioral disturbance: Secondary | ICD-10-CM

## 2023-08-21 DIAGNOSIS — R944 Abnormal results of kidney function studies: Secondary | ICD-10-CM | POA: Diagnosis present

## 2023-08-21 DIAGNOSIS — K5731 Diverticulosis of large intestine without perforation or abscess with bleeding: Principal | ICD-10-CM | POA: Diagnosis present

## 2023-08-21 DIAGNOSIS — N1831 Chronic kidney disease, stage 3a: Secondary | ICD-10-CM | POA: Diagnosis present

## 2023-08-21 DIAGNOSIS — K921 Melena: Secondary | ICD-10-CM | POA: Diagnosis not present

## 2023-08-21 DIAGNOSIS — K635 Polyp of colon: Secondary | ICD-10-CM

## 2023-08-21 DIAGNOSIS — K5730 Diverticulosis of large intestine without perforation or abscess without bleeding: Secondary | ICD-10-CM | POA: Diagnosis not present

## 2023-08-21 DIAGNOSIS — I771 Stricture of artery: Secondary | ICD-10-CM | POA: Diagnosis present

## 2023-08-21 DIAGNOSIS — Z9841 Cataract extraction status, right eye: Secondary | ICD-10-CM

## 2023-08-21 DIAGNOSIS — I10 Essential (primary) hypertension: Secondary | ICD-10-CM | POA: Diagnosis not present

## 2023-08-21 DIAGNOSIS — D122 Benign neoplasm of ascending colon: Secondary | ICD-10-CM | POA: Diagnosis present

## 2023-08-21 DIAGNOSIS — Z79899 Other long term (current) drug therapy: Secondary | ICD-10-CM

## 2023-08-21 DIAGNOSIS — J45909 Unspecified asthma, uncomplicated: Secondary | ICD-10-CM | POA: Diagnosis present

## 2023-08-21 DIAGNOSIS — D62 Acute posthemorrhagic anemia: Secondary | ICD-10-CM

## 2023-08-21 DIAGNOSIS — Z9049 Acquired absence of other specified parts of digestive tract: Secondary | ICD-10-CM

## 2023-08-21 DIAGNOSIS — F03A0 Unspecified dementia, mild, without behavioral disturbance, psychotic disturbance, mood disturbance, and anxiety: Secondary | ICD-10-CM | POA: Diagnosis present

## 2023-08-21 DIAGNOSIS — K922 Gastrointestinal hemorrhage, unspecified: Secondary | ICD-10-CM | POA: Diagnosis not present

## 2023-08-21 DIAGNOSIS — Z961 Presence of intraocular lens: Secondary | ICD-10-CM | POA: Diagnosis present

## 2023-08-21 DIAGNOSIS — K641 Second degree hemorrhoids: Secondary | ICD-10-CM | POA: Diagnosis present

## 2023-08-21 DIAGNOSIS — W19XXXA Unspecified fall, initial encounter: Secondary | ICD-10-CM | POA: Diagnosis present

## 2023-08-21 LAB — COMPREHENSIVE METABOLIC PANEL WITH GFR
ALT: 9 U/L (ref 0–44)
AST: 15 U/L (ref 15–41)
Albumin: 3.2 g/dL — ABNORMAL LOW (ref 3.5–5.0)
Alkaline Phosphatase: 62 U/L (ref 38–126)
Anion gap: 7 (ref 5–15)
BUN: 26 mg/dL — ABNORMAL HIGH (ref 8–23)
CO2: 25 mmol/L (ref 22–32)
Calcium: 8.8 mg/dL — ABNORMAL LOW (ref 8.9–10.3)
Chloride: 106 mmol/L (ref 98–111)
Creatinine, Ser: 0.9 mg/dL (ref 0.44–1.00)
GFR, Estimated: >60 mL/min (ref >60)
Glucose, Bld: 122 mg/dL — ABNORMAL HIGH (ref 70–99)
Potassium: 4.1 mmol/L (ref 3.5–5.1)
Sodium: 138 mmol/L (ref 135–145)
Total Bilirubin: 0.6 mg/dL (ref 0.0–1.2)
Total Protein: 6.7 g/dL (ref 6.5–8.1)

## 2023-08-21 LAB — CBC
HCT: 30.7 % — ABNORMAL LOW (ref 36.0–46.0)
HCT: 29.5 % — ABNORMAL LOW (ref 36.0–46.0)
HCT: 22.9 % — ABNORMAL LOW (ref 36.0–46.0)
Hemoglobin: 10.1 g/dL — ABNORMAL LOW (ref 12.0–15.0)
Hemoglobin: 9.6 g/dL — ABNORMAL LOW (ref 12.0–15.0)
Hemoglobin: 7.5 g/dL — ABNORMAL LOW (ref 12.0–15.0)
MCH: 32.4 pg (ref 26.0–34.0)
MCH: 31.8 pg (ref 26.0–34.0)
MCH: 32.3 pg (ref 26.0–34.0)
MCHC: 32.9 g/dL (ref 30.0–36.0)
MCHC: 32.5 g/dL (ref 30.0–36.0)
MCHC: 32.8 g/dL (ref 30.0–36.0)
MCV: 98.4 fL (ref 80.0–100.0)
MCV: 97.7 fL (ref 80.0–100.0)
MCV: 98.7 fL (ref 80.0–100.0)
Platelets: 270 10*3/uL (ref 150–400)
Platelets: 285 10*3/uL (ref 150–400)
Platelets: 250 10*3/uL (ref 150–400)
RBC: 3.12 MIL/uL — ABNORMAL LOW (ref 3.87–5.11)
RBC: 3.02 MIL/uL — ABNORMAL LOW (ref 3.87–5.11)
RBC: 2.32 MIL/uL — ABNORMAL LOW (ref 3.87–5.11)
RDW: 14.1 % (ref 11.5–15.5)
RDW: 13.9 % (ref 11.5–15.5)
RDW: 14.1 % (ref 11.5–15.5)
WBC: 8 10*3/uL (ref 4.0–10.5)
WBC: 8.5 10*3/uL (ref 4.0–10.5)
WBC: 13.2 10*3/uL — ABNORMAL HIGH (ref 4.0–10.5)
nRBC: 0 % (ref 0.0–0.2)
nRBC: 0 % (ref 0.0–0.2)
nRBC: 0 % (ref 0.0–0.2)

## 2023-08-21 LAB — GLUCOSE, CAPILLARY: Glucose-Capillary: 117 mg/dL — ABNORMAL HIGH (ref 70–99)

## 2023-08-21 LAB — IRON AND TIBC
Iron: 79 ug/dL (ref 28–170)
Saturation Ratios: 24 % (ref 10.4–31.8)
TIBC: 326 ug/dL (ref 250–450)
UIBC: 247 ug/dL

## 2023-08-21 LAB — FOLATE: Folate: 17.2 ng/mL (ref >5.9)

## 2023-08-21 LAB — PREPARE RBC (CROSSMATCH)

## 2023-08-21 LAB — FERRITIN: Ferritin: 26 ng/mL (ref 11–307)

## 2023-08-21 LAB — VITAMIN B12: Vitamin B-12: 620 pg/mL (ref 180–914)

## 2023-08-21 LAB — ABO/RH: ABO/RH(D): A POS

## 2023-08-21 MED ORDER — MIRTAZAPINE 15 MG PO TABS
15.0000 mg | ORAL_TABLET | Freq: Every day | ORAL | Status: DC
  Administered 2023-08-21 – 2023-08-25 (×4): 15 mg via ORAL
  Filled 2023-08-21 (×5): qty 1

## 2023-08-21 MED ORDER — MEMANTINE HCL 5 MG PO TABS
10.0000 mg | ORAL_TABLET | Freq: Two times a day (BID) | ORAL | Status: DC
  Administered 2023-08-21 – 2023-08-22 (×2): 10 mg via ORAL
  Filled 2023-08-21 (×2): qty 2

## 2023-08-21 MED ORDER — SODIUM CHLORIDE 0.9% IV SOLUTION: Freq: Once | INTRAVENOUS | Status: AC

## 2023-08-21 MED ORDER — PANTOPRAZOLE SODIUM 40 MG IV SOLR
40.0000 mg | Freq: Two times a day (BID) | INTRAVENOUS | Status: DC
  Administered 2023-08-21: 40 mg via INTRAVENOUS
  Filled 2023-08-21 (×2): qty 10

## 2023-08-21 MED ORDER — BRIMONIDINE TARTRATE 0.2 % OP SOLN
1.0000 [drp] | Freq: Two times a day (BID) | OPHTHALMIC | Status: DC
  Administered 2023-08-21 – 2023-08-25 (×6): 1 [drp] via OPHTHALMIC
  Filled 2023-08-21: qty 5

## 2023-08-21 MED ORDER — TRAVOPROST 0.004 % OP SOLN: 1.0000 [drp] | Freq: Every day | OPHTHALMIC | Status: DC

## 2023-08-21 MED ORDER — ORAL CARE MOUTH RINSE: 15.0000 mL | OROMUCOSAL | Status: DC | PRN

## 2023-08-21 MED ORDER — SODIUM CHLORIDE 0.9 % IV SOLN: INTRAVENOUS | Status: AC

## 2023-08-21 MED ORDER — IOHEXOL 350 MG/ML SOLN
100.0000 mL | Freq: Once | INTRAVENOUS | Status: AC | PRN
  Administered 2023-08-21: 100 mL via INTRAVENOUS

## 2023-08-21 MED ORDER — AMLODIPINE BESYLATE 5 MG PO TABS
5.0000 mg | ORAL_TABLET | Freq: Every day | ORAL | Status: DC
  Administered 2023-08-22: 5 mg via ORAL
  Filled 2023-08-21: qty 1

## 2023-08-21 MED ORDER — POLYETHYLENE GLYCOL 3350 17 GM/SCOOP PO POWD
238.0000 g | Freq: Once | ORAL | Status: AC
  Administered 2023-08-22: 238 g via ORAL
  Filled 2023-08-21: qty 238

## 2023-08-21 MED ORDER — ONDANSETRON HCL 4 MG/2ML IJ SOLN
4.0000 mg | Freq: Four times a day (QID) | INTRAMUSCULAR | Status: DC | PRN
  Administered 2023-08-22: 4 mg via INTRAVENOUS
  Filled 2023-08-21: qty 2

## 2023-08-21 MED ORDER — DORZOLAMIDE HCL-TIMOLOL MAL 2-0.5 % OP SOLN
1.0000 [drp] | Freq: Two times a day (BID) | OPHTHALMIC | Status: DC
  Administered 2023-08-21 – 2023-08-25 (×6): 1 [drp] via OPHTHALMIC
  Filled 2023-08-21: qty 10

## 2023-08-21 MED ORDER — POLYETHYLENE GLYCOL 3350 17 G PO PACK: 17.0000 g | PACK | Freq: Every day | ORAL | Status: DC | PRN

## 2023-08-21 MED ORDER — PANTOPRAZOLE SODIUM 40 MG IV SOLR
40.0000 mg | Freq: Two times a day (BID) | INTRAVENOUS | Status: DC
  Administered 2023-08-21 – 2023-08-22 (×2): 40 mg via INTRAVENOUS
  Filled 2023-08-21: qty 10

## 2023-08-21 MED ORDER — ACETAMINOPHEN 650 MG RE SUPP: 650.0000 mg | Freq: Four times a day (QID) | RECTAL | Status: DC | PRN

## 2023-08-21 MED ORDER — LATANOPROST 0.005 % OP SOLN
1.0000 [drp] | Freq: Every day | OPHTHALMIC | Status: DC
  Administered 2023-08-21 – 2023-08-25 (×4): 1 [drp] via OPHTHALMIC
  Filled 2023-08-21: qty 2.5

## 2023-08-21 MED ORDER — ACETAMINOPHEN 325 MG PO TABS
650.0000 mg | ORAL_TABLET | Freq: Four times a day (QID) | ORAL | Status: DC | PRN
  Administered 2023-08-25: 650 mg via ORAL
  Filled 2023-08-21: qty 2

## 2023-08-21 MED ORDER — DONEPEZIL HCL 5 MG PO TABS
10.0000 mg | ORAL_TABLET | Freq: Every day | ORAL | Status: DC
  Administered 2023-08-22: 10 mg via ORAL
  Filled 2023-08-21: qty 2

## 2023-08-21 MED ORDER — ONDANSETRON HCL 4 MG PO TABS: 4.0000 mg | ORAL_TABLET | Freq: Four times a day (QID) | ORAL | Status: DC | PRN

## 2023-08-21 NOTE — H&P (Signed)
 History and Physical    Patient: Helen Herring LKG:[id number] DOB: [birth year] DOA: 08/21/2023 DOS: the patient was seen and examined on 08/21/2023 PCP: Little Riff, MD  Patient coming from: ALF/ILF  Chief Complaint:  Chief Complaint  Patient presents with   Rectal Bleeding   HPI: Helen Herring is a 83 y.o. female with medical history significant of dementia, hypertension, CKD stage IIIa, B12 deficiency comes in the emergency room from [facility] with dark maroon/burgundy colored stool. Patient denies any abdominal pain. Has not had any G.I. workup in the past per daughter. Most history is obtained by talking with [name] on the phone. Patient has dementia and not able to give much history. Patient was noted to have large burgundy stool in the ER. G.I. was consulted. Hemoglobin is 10.3. Vitals otherwise stable. Patient is being admitted for G.I. bleed evaluation Review of Systems: unable to review all systems due to the inability of the patient to answer questions. Past Medical History:  Diagnosis Date   Asthma    Hypertension    Past Surgical History:  Procedure Laterality Date   ANTERIOR VITRECTOMY Right 01/20/2018   Procedure: ANTERIOR VITRECTOMY;  Surgeon: Ola Berger, MD;  Location: [facility] ORS;  Service: Ophthalmology;  Laterality: Right;   CATARACT EXTRACTION W/PHACO Right 01/20/2018   Procedure: CATARACT EXTRACTION PHACO AND INTRAOCULAR LENS PLACEMENT (IOC);  Surgeon: Ola Berger, MD;  Location: [facility] ORS;  Service: Ophthalmology;  Laterality: Right;  Lot# [id number] H [place] : 01:49.4 CDE: 21.64   CHOLECYSTECTOMY     Social History:  reports that she has never smoked. She has never used smokeless tobacco. She reports that she does not drink alcohol and does not use drugs.  No Known Allergies  History reviewed. No pertinent family history.  Prior to Admission medications   Medication Sig Start Date End Date Taking? Authorizing Provider  benazepril   (LOTENSIN ) 20 MG tablet Hold until follow up with kidney doctor. 01/07/20   Garrison Kanner, MD  dorzolamide -timolol  (COSOPT ) 22.3-6.8 MG/ML ophthalmic solution Place 1 drop into both eyes in the morning and at bedtime. 06/19/19   [provider]  gabapentin  (NEURONTIN ) 600 MG tablet Take 0.5 tablets (300 mg total) by mouth 3 (three) times daily. 01/07/20   Garrison Kanner, MD  travoprost , benzalkonium, (TRAVATAN ) 0.004 % ophthalmic solution Place 1 drop into both eyes at bedtime.    [provider]    Physical Exam: Vitals:   08/21/23 1330 08/21/23 1430 08/21/23 1500 08/21/23 1502  BP: 137/62 126/60 120/80   Pulse:      Resp: 11 10 18    Temp:    98.2 F (36.8 C)  TempSrc:    Oral  SpO2:      Weight:      Physical Exam Constitutional:      Appearance: Normal appearance.  HENT:     Head: Normocephalic and atraumatic.   Eyes:     Extraocular Movements: Extraocular movements intact.     Pupils: Pupils are equal, round, and reactive to light.    Cardiovascular:     Rate and Rhythm: Normal rate and regular rhythm.  Pulmonary:     Effort: Pulmonary effort is normal.     Breath sounds: Normal breath sounds.  Abdominal:     General: Abdomen is flat.     Palpations: Abdomen is soft.   Neurological:     General: No focal deficit present.     Mental Status: She is alert.     Assessment and Plan:  [name] is a 83 y.o. female with medical history significant of dementia, hypertension, CKD stage IIIa, B12 deficiency comes in the emergency room from [facility] with dark maroon/burgundy colored stool.  G.I. bleed acute on chronic anemia -- patient came in with maroon stools. No vomiting. Does not take any blood thinners or NSAID -- IV Protonix BID -- G.I. consultation with Dr. Baldomero Bone. Plans for EGD. -- Monitor H&H and transfuse as needed -- hemoglobin 10.3 (was 11.3 few months ago) -- patient denies any pain and is asymptomatic. -- CT abdomen shows diverticular  disease and significant atherosclerosis of intra-abdominal vessels -- transfuse as needed spoke with patient's daughter and POA Helen Herring  CKD stage IIIa -- creatinine stable at 0.9 -- trickle IV fluid  Dementia-- moderate without behavioral disturbance -- continue home meds once med rec is completed -- patient follows with neurology Dr. Walden Guise as outpatient  Hypertension -- resume meds once med rec is completed -- hold BP meds today       Advance Care Planning:   Code Status: Full Code per dater and ALF paper  Consults: GI  Family Communication: Helen Herring  Severity of Illness: The appropriate patient status for this patient is OBSERVATION. Observation status is judged to be reasonable and necessary in order to provide the required intensity of service to ensure the patient's safety. The patient's presenting symptoms, physical exam findings, and initial radiographic and laboratory data in the context of their medical condition is felt to place them at decreased risk for further clinical deterioration. Furthermore, it is anticipated that the patient will be medically stable for discharge from the hospital within 2 midnights of admission.   Author: Melvinia Stager, MD 08/21/2023 3:20 PM  For on call review [email or web address].

## 2023-08-21 NOTE — ED Provider Notes (Signed)
 [facility] Provider Note    Event Date/Time   First MD Initiated Contact with Patient 08/21/23 1106     (approximate)   History   Rectal Bleeding   HPI  Helen Herring is a 83 y.o. female with history of dementia who comes in with rectal bleeding.  I reviewed an office visit from 06/23/2023 where patient has hypertension, CKD, B12 deficiency.  She denies any prior history of bleeding.  She reports having bleeding that started today noticed by her facility.  She denies any pain.    I called patient's family member, daughter who is her POA who states that she is open to further workup for rectal bleeding and would be open to blood transfusions, colonoscopies if necessary.     Physical Exam   Triage Vital Signs: ED Triage Vitals  Encounter Vitals Group     BP 08/21/23 1045 (!) 112/58     Girls Systolic BP Percentile --      Girls Diastolic BP Percentile --      Boys Systolic BP Percentile --      Boys Diastolic BP Percentile --      Pulse Rate 08/21/23 1045 82     Resp 08/21/23 1045 16     Temp 08/21/23 1045 98.4 F (36.9 C)     Temp Source 08/21/23 1045 Oral     SpO2 08/21/23 1045 100 %     Weight 08/21/23 1026 132 lb 0.9 oz (59.9 kg)     Height --      Head Circumference --      Peak Flow --      Pain Score 08/21/23 1026 0     Pain Loc --      Pain Education --      Exclude from Growth Chart --     Most recent vital signs: Vitals:   08/21/23 1045  BP: (!) 112/58  Pulse: 82  Resp: 16  Temp: 98.4 F (36.9 C)  SpO2: 100%     General: Awake, no distress.  CV:  Good peripheral perfusion.  Resp:  Normal effort.  Abd:  No distention.  Soft and nontender Other:  Maroon like blood noted from her rectal exam without any evidence of obvious hemorrhoids   ED Results / Procedures / Treatments   Labs (all labs ordered are listed, but only abnormal results are displayed) Labs Reviewed  COMPREHENSIVE METABOLIC PANEL WITH GFR - Abnormal;  Notable for the following components:      Result Value   Glucose, Bld 122 (*)    BUN 26 (*)    Calcium 8.8 (*)    Albumin 3.2 (*)    All other components within normal limits  CBC - Abnormal; Notable for the following components:   RBC 3.12 (*)    Hemoglobin 10.1 (*)    HCT 30.7 (*)    All other components within normal limits  POC OCCULT BLOOD, ED  TYPE AND SCREEN     EKG  My interpretation of EKG:  Normal sinus rate of 77 without any ST elevation or T wave inversions, normal intervals  RADIOLOGY I have reviewed the ct personally and interpreted no obvious kidney stones   PROCEDURES:  Critical Care performed: No  .1-3 Lead EKG Interpretation  Performed by: Lubertha Rush, MD Authorized by: Lubertha Rush, MD     Interpretation: normal     ECG rate:  80   ECG rate assessment: normal  Rhythm: sinus rhythm     Ectopy: none     Conduction: normal      MEDICATIONS ORDERED IN ED: Medications - No data to display   IMPRESSION / MDM / ASSESSMENT AND PLAN / ED COURSE  I reviewed the triage vital signs and the nursing notes.   Patient's presentation is most consistent with acute presentation with potential threat to life or bodily function.   Patient comes in with maroon like blood noted from her rectal.  Blood pressures are reassuring but slightly borderline given good amount of blood on rectal exam I did decide to proceed with CT imaging to rule out acute bleed that could be amenable to IR.  I will check labs to evaluate for anemia.  CBC shows drop in hemoglobin down to 10.1 with a baseline around 11.8 and she does also have a little bit elevated BUN.  Type and screen is a positive.  CT imaging without any evidence of acute bleed.  Does have significant diverticular changes I suspect this is a diverticular bleed.  Given patient's age and no prior history of this will discuss with the hospitalist for admission for GI bleed  The patient is on the cardiac monitor  to evaluate for evidence of arrhythmia and/or significant heart rate changes.      FINAL CLINICAL IMPRESSION(S) / ED DIAGNOSES   Final diagnoses:  Lower GI bleed     Rx / DC Orders   ED Discharge Orders     None        Note:  This document was prepared using Dragon voice recognition software and may include unintentional dictation errors.   Lubertha Rush, MD 08/21/23 1310

## 2023-08-21 NOTE — Care Management Obs Status (Signed)
 MEDICARE OBSERVATION STATUS NOTIFICATION   Patient Details  Name: Helen Herring MRN: [id number] Date of Birth: [birth year]   Medicare Observation Status Notification Given:  No (Telephonically)    [place] L Leeroy Lovings, LCSW 08/21/2023, 2:32 PM

## 2023-08-21 NOTE — TOC Progression Note (Signed)
 Transition of Care [facility]) - Progression Note    Patient Details  Name: Helen Herring MRN: [id number] Date of Birth: 03-24-1940  Transition of Care [facility]) CM/SW Contact  [place] L Marc Siver[place], [place] Phone Number: 08/21/2023, 2:33 PM  Clinical Narrative:     CSW contacted pt POA, Verlan Glazier. TOC consult completed telephonically. MOON letter reviewed. Ms. Geraghty advised that her mother came to the ED from [facility] and she will be returning. There are no home care/DME needs at this time. Pt will be returning to [facility].   Ms. Meininger advised that her mother will be admitted to a floor per MD that contacted her earlier.        Expected Discharge Plan and Services                                               Social Determinants of Health (SDOH) Interventions SDOH Screenings   Food Insecurity: No Food Insecurity (05/20/2023)   Received from [facility]  Housing: Low Risk  (05/20/2023)   Received from [facility]  Transportation Needs: Unmet Transportation Needs (05/20/2023)   Received from [facility]  Utilities: Not At Risk (05/20/2023)   Received from [facility]  Financial Resource Strain: Low Risk  (05/20/2023)   Received from [facility]  Tobacco Use: Low Risk  (08/21/2023)  Recent Concern: Tobacco Use - Medium Risk (07/26/2023)   Received from [facility]    Readmission Risk Interventions     No data to display

## 2023-08-21 NOTE — Progress Notes (Signed)
   08/21/23 2121  What Happened  Was fall witnessed? Yes  Who witnessed fall? Odilia Bennett NT  Patients activity before fall bathroom-assisted  Point of contact buttocks (gently helped to a sitting position)  Was patient injured? No  Provider Notification  Provider Name/Title Elisabeth Guild, NP  Date Provider Notified 08/21/23  Time Provider Notified 2129  Method of Notification Page  Notification Reason Fall  Provider response At bedside;See new orders  Date of Provider Response 08/21/23  Time of Provider Response 2130  Vitals  Temp (!) 97.3 F (36.3 C)  Temp Source Axillary  BP 96/61  MAP (mmHg) 71  BP Location Right Arm  BP Method Automatic  Patient Position (if appropriate) Lying  Pulse Rate 79  Resp 16  Oxygen Therapy  SpO2 100 %  O2 Device Room Air

## 2023-08-21 NOTE — ED Triage Notes (Signed)
 BIBEMS, coming for rectal bleeding. PMH: dementia, GCS 14 baseline

## 2023-08-21 NOTE — Significant Event (Signed)
       CROSS COVER NOTE  NAME: Helen Herring MRN: [id number] DOB : 10-03-1940 ATTENDING PHYSICIAN: Melvinia Stager, MD    Date of Service   08/21/2023   HPI/Events of Note   Message received from nurse Hello! Helen Herring is from a nursing home with a GI bleed, she was transferring to the [facility] with our NT when she became weak and was helped down to the floor (witnessed fall). She was helped back up and had 1 bloody stool. Her BP was initially low, but recheck once patient settled in bed was 96/71, all other VS stable. She is at her baseline neuro status (AAOx1). She is not in any pain but states she feels terrible. She is getting 50 ml/hr NS. Lab collected the scheduled CBC right after this stool. Thank you!   Interventions   Assessment/Plan:    08/21/2023    9:21 PM 08/21/2023    3:33 PM 08/21/2023    3:00 PM  Vitals with BMI  Systolic 96 138 120  Diastolic 61 65 80  Pulse 79 78    Cbg 117 3 bloody bowel movements today Patient alert, no obvious distress, slight paleness.    Latest Reference Range & Units 08/21/23 15:34 08/21/23 21:23  WBC 4.0 - 10.5 K/uL 8.5 13.2 (H)  RBC 3.87 - 5.11 MIL/uL 3.02 (L) 2.32 (L)  Hemoglobin 12.0 - 15.0 g/dL 9.6 (L) 7.5 (L)  HCT 40.9 - 46.0 % 29.5 (L) 22.9 (L)  MCV 80.0 - 100.0 fL 97.7 98.7  MCH 26.0 - 34.0 pg 31.8 32.3  MCHC 30.0 - 36.0 g/dL 81.1 91.4  RDW 78.2 - 95.6 % 13.9 14.1  Platelets 150 - 400 K/uL 285 250  nRBC 0.0 - 0.2 % 0.0 0.0  (H): Data is abnormally high (L): Data is abnormally low  Likely vagal response bedrest Cardiac monitoring Transfuse 1 unit prbc - discussed with and received permission for transfusion from [name] over the phone     Kip Peon NP [facility] Cross Cover 7pm-7am - check amion for availability Pager [phone]

## 2023-08-21 NOTE — ED Triage Notes (Signed)
 Arrives via [facility] from [facility] c/o bleeding with BM.  Denies dizziness.  VS wnl

## 2023-08-21 NOTE — Plan of Care (Signed)
  Problem: Clinical Measurements: Goal: Respiratory complications will improve Outcome: Progressing Goal: Cardiovascular complication will be avoided Outcome: Progressing   Problem: Nutrition: Goal: Adequate nutrition will be maintained Outcome: Progressing   Problem: Elimination: Goal: Will not experience complications related to urinary retention Outcome: Progressing   Problem: Pain Managment: Goal: General experience of comfort will improve and/or be controlled Outcome: Progressing   Problem: Safety: Goal: Ability to remain free from injury will improve Outcome: Progressing   Problem: Skin Integrity: Goal: Risk for impaired skin integrity will decrease Outcome: Progressing   Problem: Education: Goal: Knowledge of General Education information will improve Description: Including pain rating scale, medication(s)/side effects and non-pharmacologic comfort measures Outcome: Not Progressing   Problem: Health Behavior/Discharge Planning: Goal: Ability to manage health-related needs will improve Outcome: Not Progressing   Problem: Elimination: Goal: Will not experience complications related to bowel motility Outcome: Not Progressing

## 2023-08-21 NOTE — Consult Note (Signed)
 Karma Oz, MD [address]  [address]  [place], [place] [address]  Main: [phone]  Fax: [phone] Pager: [phone]   Consultation  Referring Provider:     No ref. provider found Primary Care Physician:  Little Riff, MD Primary Gastroenterologist: Para Bold        Reason for Consultation: Hematochezia, anemia  Date of Admission:  08/21/2023 Date of Consultation:  08/21/2023         HPI:   Helen Herring is a 83 y.o. female with history of hypertension presented from [facility] with rectal bleeding noticed at her facility today when she was having a bowel movement.  She denies any abdominal pain, rectal pain, nausea or vomiting.  No known history of constipation.  When I saw the patient in the ER, she said she felt like having a bowel movement and when I did a rectal exam she had a large burgundy color bowel movement with no solid stool.  Patient's nurse was bedside.  She has been hemodynamically stable since arrival to the ER, labs revealed mild normocytic anemia hemoglobin 10.1, platelets 270, elevated BUN/creatinine 26/0.9.  Patient is kept NPO.  She also underwent CT angio GI bleed protocol which revealed high-grade stenosis at the origin of celiac artery and left renal artery.  Extensive diverticular changes throughout the colon without evidence of diverticulitis, no active GI bleeding identified.   NSAIDs: None  Antiplts/Anticoagulants/Anti thrombotics: None  GI Procedures: None  Past Medical History:  Diagnosis Date   Asthma    Hypertension     Past Surgical History:  Procedure Laterality Date   ANTERIOR VITRECTOMY Right 01/20/2018   Procedure: ANTERIOR VITRECTOMY;  Surgeon: Ola Berger, MD;  Location: [facility] ORS;  Service: Ophthalmology;  Laterality: Right;   CATARACT EXTRACTION W/PHACO Right 01/20/2018   Procedure: CATARACT EXTRACTION PHACO AND INTRAOCULAR LENS PLACEMENT (IOC);  Surgeon: Ola Berger, MD;  Location: [facility] ORS;   Service: Ophthalmology;  Laterality: Right;  Lot# [id number] H US : 01:49.4 CDE: 21.64   CHOLECYSTECTOMY       Current Facility-Administered Medications:    0.9 %  sodium chloride  infusion, , Intravenous, Continuous, Patel, Sona, MD   pantoprazole (PROTONIX) injection 40 mg, 40 mg, Intravenous, Q12H, Patel, Sona, MD   polyethylene glycol powder (GLYCOLAX/MIRALAX) container 238 g, 238 g, Oral, Once, Christinamarie Tall Reddy, MD  Current Outpatient Medications:    benazepril  (LOTENSIN ) 20 MG tablet, Hold until follow up with kidney doctor., Disp: 30 tablet, Rfl: 0   dorzolamide -timolol  (COSOPT ) 22.3-6.8 MG/ML ophthalmic solution, Place 1 drop into both eyes in the morning and at bedtime., Disp: , Rfl:    gabapentin  (NEURONTIN ) 600 MG tablet, Take 0.5 tablets (300 mg total) by mouth 3 (three) times daily., Disp: , Rfl:    travoprost , benzalkonium, (TRAVATAN ) 0.004 % ophthalmic solution, Place 1 drop into both eyes at bedtime., Disp: , Rfl:    History reviewed. No pertinent family history.   Social History   Tobacco Use   Smoking status: Never   Smokeless tobacco: Never  Vaping Use   Vaping status: Never Used  Substance Use Topics   Alcohol use: Never   Drug use: Never    Allergies as of 08/21/2023   (No Known Allergies)    Review of Systems:    All systems reviewed and negative except where noted in HPI.   Physical Exam:  Vital signs in last 24 hours: Temp:  [98.2 F (36.8 C)-98.4 F (36.9 C)] 98.2 F (36.8 C) (  06/14 1502) Pulse Rate:  [82] 82 (06/14 1045) Resp:  [10-18] 18 (06/14 1500) BP: (112-137)/(58-80) 120/80 (06/14 1500) SpO2:  [100 %] 100 % (06/14 1045) Weight:  [59.9 kg] 59.9 kg (06/14 1026)   General:   Pleasant, cooperative in NAD Head:  Normocephalic and atraumatic. Eyes:   No icterus.   Conjunctiva pink. PERRLA. Ears:  Normal auditory acuity. Neck:  Supple; no masses or thyroidomegaly Lungs: Respirations even and unlabored. Lungs clear to auscultation  bilaterally.   No wheezes, crackles, or rhonchi.  Heart:  Regular rate and rhythm;  Without murmur, clicks, rubs or gallops Abdomen:  Soft, nondistended, nontender. Normal bowel sounds. No appreciable masses or hepatomegaly.  No rebound or guarding.  Rectal: Digital rectal exam revealed burgundy colored stool Msk:  Symmetrical without gross deformities.  Strength generalized weakness Extremities:  Without edema, cyanosis or clubbing. Neurologic:  Alert and oriented x3;  grossly normal neurologically. Skin:  Intact without significant lesions or rashes. Psych:  Alert and cooperative. Normal affect.  LAB RESULTS:    Latest Ref Rng & Units 08/21/2023   10:48 AM 02/02/2022    4:26 PM 05/05/2020    6:04 AM  CBC  WBC 4.0 - 10.5 K/uL 8.0  4.8  6.1   Hemoglobin 12.0 - 15.0 g/dL 16.1  09.6  04.5   Hematocrit 36.0 - 46.0 % 30.7  34.5  33.1   Platelets 150 - 400 K/uL 270  225  212     BMET    Latest Ref Rng & Units 08/21/2023   10:48 AM 02/02/2022    4:27 PM 05/05/2020    6:04 AM  BMP  Glucose 70 - 99 mg/dL 409  811  914   BUN 8 - 23 mg/dL 26  12  9    Creatinine 0.44 - 1.00 mg/dL 7.82  9.56  2.13   Sodium 135 - 145 mmol/L 138  135  139   Potassium 3.5 - 5.1 mmol/L 4.1  4.0  3.5   Chloride 98 - 111 mmol/L 106  101  107   CO2 22 - 32 mmol/L 25  27  26    Calcium 8.9 - 10.3 mg/dL 8.8  9.1  9.1     LFT    Latest Ref Rng & Units 08/21/2023   10:48 AM 02/02/2022    4:27 PM 01/04/2020    3:52 AM  Hepatic Function  Total Protein 6.5 - 8.1 g/dL 6.7  7.1  7.5   Albumin 3.5 - 5.0 g/dL 3.2  3.6  3.9   AST 15 - 41 U/L 15  19  19    ALT 0 - 44 U/L 9  12  12    Alk Phosphatase 38 - 126 U/L 62  53  76   Total Bilirubin 0.0 - 1.2 mg/dL 0.6  0.5  1.2      STUDIES: CT Angio Abd/Pel W and/or Wo Contrast Result Date: 08/21/2023 EXAM: CTA ABDOMEN AND PELVIS WITH AND WITHOUT CONTRAST 08/21/2023 12:33:01 PM TECHNIQUE: CTA images of the abdomen and pelvis with and without intravenous contrast.  Three-dimensional MIP/volume rendered formations were performed. Automated exposure control, iterative reconstruction, and/or weight based adjustment of the mA/kV was utilized to reduce the radiation dose to as low as reasonably achievable. COMPARISON: None available. CLINICAL HISTORY: FINDINGS: VASCULATURE: Extensive atherosclerotic calcifications are present in the abdominal aorta and branch vessels without aneurysm. High-grade stenosis is present on the left. Moderate stenosis is present in the right internal iliac artery. Atherosclerotic calcifications are present in  the femoral arteries bilaterally without focal stenosis. GI BLEED: No active extravasation of contrast within the GI tract. AORTA: No acute finding. No abdominal aortic aneurysm. No dissection. CELIAC TRUNK: High-grade stenosis at the origin of the celiac artery. SUPERIOR MESEMTRIC ARTERY: Atherosclerotic changes are present in the proximal SMA without focal stenosis. INFERIOR MESENTERIC ARTERY: Atherosclerotic calcifications are present at the IMA origin without significant stenosis. RENAL ARTERIES: Atherosclerotic calcifications are present at the renal artery origins bilaterally. High-grade stenosis is present on the left. ILIAC ARTERIES: Moderate stenosis is present in the right internal iliac artery. Atherosclerotic calcifications are present in the femoral arteries bilaterally without focal stenosis. ABDOMEN/PELVIS: LOWER CHEST: Bilateral bronchiectasis is noted. Peripheral honeycombing is present at the lung bases, right greater than left. LIVER: Cholecystectomy is noted. GALLBLADDER AND BILE DUCTS: Cholecystectomy is noted. SPLEEN: The spleen is unremarkable. PANCREAS: The pancreas is unremarkable. ADRENAL GLANDS: Bilateral adrenal glands demonstrate no acute abnormality. KIDNEYS, URETERS AND BLADDER: A 4.5 cm simple cyst is present at the upper pole of the left kidney. A 2.0 cm simple cyst is present at the lower pole of the right kidney.  No stones in the kidneys or ureters. No hydronephrosis. No perinephric or periureteral stranding. Urinary bladder is unremarkable. GI AND BOWEL: A small hiatal hernia is present. Extensive diverticular changes are present throughout the colon. No focal inflammatory changes are present to suggest diverticulitis. Stomach and duodenal sweep demonstrate no acute abnormality. There is no bowel obstruction. No abnormal bowel wall thickening or distension. No acute appendicitis. REPRODUCTIVE: Reproductive organs are unremarkable. PERITONEUM AND RETROPERITONEUM: No ascites or free air. LYMPH NODES: No lymphadenopathy. BONES AND SOFT TISSUES: Remote compression fractures. No acute soft tissue abnormality. IMPRESSION: 1. No active GI bleeding. 2. Extensive atherosclerotic calcifications in the abdominal aorta and branch vessels without aneurysm. High-grade stenosis at the origin of the celiac artery and in the left renal artery. Moderate stenosis in the right internal iliac artery. 3. Extensive diverticular changes throughout the colon without evidence of diverticulitis. Electronically signed by: Audree Leas MD 08/21/2023 01:02 PM EDT RP Workstation: [id number]      Impression / Plan:   Helen Herring is a 83 y.o. female with history of high-grade celiac artery stenosis, hypertension, mild dementia presented from nursing home with hematochezia and mild blood loss anemia, elevated BUN/creatinine  Hematochezia with acute blood loss anemia CT angio GI bleed protocol did not reveal active extravasation There was imaging evidence of diffuse colonic diverticulosis With elevated BUN/creatinine and dark maroon bowel movements, differentials include peptic ulcer disease or small bowel bleed or proximal colonic bleed Recommend to start Protonix 40 mg IV twice daily Maintain 2 large-bore IVs Monitor CBC closely, every 6-8 hours Check iron panel, B12 and folate levels Clear liquid diet today N.p.o. effective 5  AM tomorrow Recommend EGD and colonoscopy with possible video capsule endoscopy MiraLAX bowel prep ordered  Thank you for involving me in the care of this patient.  GI will follow along with you    LOS: 0 days   Ellis Guys, MD  08/21/2023, 3:07 PM    Note: This dictation was prepared with Dragon dictation along with smaller phrase technology. Any transcriptional errors that result from this process are unintentional.

## 2023-08-22 ENCOUNTER — Encounter: Payer: Self-pay | Admitting: Internal Medicine

## 2023-08-22 ENCOUNTER — Encounter
Admission: EM | Disposition: A | Discharge status: Home / Self Care | Payer: Self-pay | Source: Skilled Nursing Facility | Attending: Internal Medicine

## 2023-08-22 ENCOUNTER — Observation Stay: Admitting: Anesthesiology

## 2023-08-22 DIAGNOSIS — F0390 Unspecified dementia without behavioral disturbance: Secondary | ICD-10-CM | POA: Diagnosis not present

## 2023-08-22 DIAGNOSIS — I10 Essential (primary) hypertension: Secondary | ICD-10-CM | POA: Diagnosis not present

## 2023-08-22 DIAGNOSIS — D62 Acute posthemorrhagic anemia: Secondary | ICD-10-CM | POA: Diagnosis not present

## 2023-08-22 DIAGNOSIS — K922 Gastrointestinal hemorrhage, unspecified: Secondary | ICD-10-CM | POA: Diagnosis not present

## 2023-08-22 HISTORY — PX: ESOPHAGOGASTRODUODENOSCOPY: SHX5428

## 2023-08-22 LAB — CBC
HCT: 24.8 % — ABNORMAL LOW (ref 36.0–46.0)
HCT: 23.9 % — ABNORMAL LOW (ref 36.0–46.0)
Hemoglobin: 8.3 g/dL — ABNORMAL LOW (ref 12.0–15.0)
Hemoglobin: 8 g/dL — ABNORMAL LOW (ref 12.0–15.0)
MCH: 32.4 pg (ref 26.0–34.0)
MCH: 32.5 pg (ref 26.0–34.0)
MCHC: 33.5 g/dL (ref 30.0–36.0)
MCHC: 33.5 g/dL (ref 30.0–36.0)
MCV: 96.9 fL (ref 80.0–100.0)
MCV: 97.2 fL (ref 80.0–100.0)
Platelets: 175 10*3/uL (ref 150–400)
Platelets: 216 10*3/uL (ref 150–400)
RBC: 2.56 MIL/uL — ABNORMAL LOW (ref 3.87–5.11)
RBC: 2.46 MIL/uL — ABNORMAL LOW (ref 3.87–5.11)
RDW: 14.2 % (ref 11.5–15.5)
RDW: 14.4 % (ref 11.5–15.5)
WBC: 9.1 10*3/uL (ref 4.0–10.5)
WBC: 18.3 10*3/uL — ABNORMAL HIGH (ref 4.0–10.5)
nRBC: 0 % (ref 0.0–0.2)
nRBC: 0 % (ref 0.0–0.2)

## 2023-08-22 LAB — BASIC METABOLIC PANEL WITH GFR
Anion gap: 8 (ref 5–15)
BUN: 29 mg/dL — ABNORMAL HIGH (ref 8–23)
CO2: 20 mmol/L — ABNORMAL LOW (ref 22–32)
Calcium: 7.9 mg/dL — ABNORMAL LOW (ref 8.9–10.3)
Chloride: 110 mmol/L (ref 98–111)
Creatinine, Ser: 0.74 mg/dL (ref 0.44–1.00)
GFR, Estimated: >60 mL/min (ref >60)
Glucose, Bld: 104 mg/dL — ABNORMAL HIGH (ref 70–99)
Potassium: 4.1 mmol/L (ref 3.5–5.1)
Sodium: 138 mmol/L (ref 135–145)

## 2023-08-22 SURGERY — EGD (ESOPHAGOGASTRODUODENOSCOPY)
Anesthesia: General

## 2023-08-22 MED ORDER — LIDOCAINE HCL (PF) 2 % IJ SOLN
INTRAMUSCULAR | Status: AC
  Filled 2023-08-22: qty 5

## 2023-08-22 MED ORDER — PROPOFOL 10 MG/ML IV BOLUS
INTRAVENOUS | Status: AC
  Filled 2023-08-22: qty 40

## 2023-08-22 MED ORDER — PROPOFOL 10 MG/ML IV BOLUS
INTRAVENOUS | Status: DC | PRN
  Administered 2023-08-22: 50 mg via INTRAVENOUS

## 2023-08-22 MED ORDER — IRON SUCROSE 200 MG IVPB - SIMPLE MED
200.0000 mg | Status: AC
  Administered 2023-08-22 – 2023-08-24 (×2): 200 mg via INTRAVENOUS
  Filled 2023-08-22 (×3): qty 200

## 2023-08-22 MED ORDER — PHENYLEPHRINE HCL (PRESSORS) 10 MG/ML IV SOLN
INTRAVENOUS | Status: DC | PRN
  Administered 2023-08-22: 80 ug via INTRAVENOUS

## 2023-08-22 MED ORDER — PROPOFOL 500 MG/50ML IV EMUL
INTRAVENOUS | Status: DC | PRN
  Administered 2023-08-22: 100 ug/kg/min via INTRAVENOUS

## 2023-08-22 MED ORDER — SODIUM CHLORIDE 0.9 % IV SOLN: INTRAVENOUS | Status: DC

## 2023-08-22 MED ORDER — LIDOCAINE 2% (20 MG/ML) 5 ML SYRINGE
INTRAMUSCULAR | Status: DC | PRN
  Administered 2023-08-22: 20 mg via INTRAVENOUS

## 2023-08-22 NOTE — Plan of Care (Signed)
   Problem: Coping: Goal: Level of anxiety will decrease Outcome: Progressing   Problem: Pain Managment: Goal: General experience of comfort will improve and/or be controlled Outcome: Progressing

## 2023-08-22 NOTE — Transfer of Care (Signed)
 Immediate Anesthesia Transfer of Care Note  Patient: Helen Herring  Procedure(s) Performed: EGD (ESOPHAGOGASTRODUODENOSCOPY)  Patient Location: Endoscopy Unit  Anesthesia Type:General  Level of Consciousness: awake  Airway & Oxygen Therapy: Patient Spontanous Breathing  Post-op Assessment: Report given to RN and Post -op Vital signs reviewed and stable  Post vital signs: Reviewed  Last Vitals:  Vitals Value Taken Time  BP 118/56 08/22/23 10:47  Temp 36.6 C 08/22/23 10:42  Pulse 103 08/22/23 10:47  Resp 16 08/22/23 10:48  SpO2 97 % 08/22/23 10:47  Vitals shown include unfiled device data.  Last Pain:  Vitals:   08/22/23 1043  TempSrc:   PainSc: 0-No pain         Complications: No notable events documented.

## 2023-08-22 NOTE — Plan of Care (Signed)

## 2023-08-22 NOTE — Progress Notes (Signed)
 [facility]  - [place] at [facility]   PATIENT NAME: Helen Herring    MR#:  [id number]  DATE OF BIRTH:  [birth year]  SUBJECTIVE:  no family at bedside. No blood he stools this morning. Had one last night. EGD negative.  Daughter received colonoscopy prep due to confusion by RN regarding orders which were not released. Patient drinking prep well. She had weakness and nontraumatic fall last night.    VITALS:  Blood pressure 128/70, pulse 90, temperature 97.8 F (36.6 C), temperature source Temporal, resp. rate 14, weight 59.9 kg, SpO2 95%.  PHYSICAL EXAMINATION:   GENERAL:  83 y.o.-year-old patient with no acute distress.  LUNGS: Normal breath sounds bilaterally, no wheezing CARDIOVASCULAR: S1, S2 normal. No murmur   ABDOMEN: Soft, nontender, nondistended. EXTREMITIES: No  edema b/l.    NEUROLOGIC: nonfocal  patient is alert and awake, dementia at baseline  LABORATORY PANEL:  CBC Recent Labs  Lab 08/22/23 1301  WBC 18.3*  HGB 8.0*  HCT 23.9*  PLT 216    Chemistries  Recent Labs  Lab 08/21/23 1048  NA 138  K 4.1  CL 106  CO2 25  GLUCOSE 122*  BUN 26*  CREATININE 0.90  CALCIUM 8.8*  AST 15  ALT 9  ALKPHOS 62  BILITOT 0.6   Cardiac Enzymes No results for input(s): TROPONINI in the last 168 hours. RADIOLOGY:  CT Angio Abd/Pel W and/or Wo Contrast Result Date: 08/21/2023 EXAM: CTA ABDOMEN AND PELVIS WITH AND WITHOUT CONTRAST 08/21/2023 12:33:01 PM TECHNIQUE: CTA images of the abdomen and pelvis with and without intravenous contrast. Three-dimensional MIP/volume rendered formations were performed. Automated exposure control, iterative reconstruction, and/or weight based adjustment of the mA/kV was utilized to reduce the radiation dose to as low as reasonably achievable. COMPARISON: None available. CLINICAL HISTORY: FINDINGS: VASCULATURE: Extensive atherosclerotic calcifications are present in the abdominal aorta and branch vessels without  aneurysm. High-grade stenosis is present on the left. Moderate stenosis is present in the right internal iliac artery. Atherosclerotic calcifications are present in the femoral arteries bilaterally without focal stenosis. GI BLEED: No active extravasation of contrast within the GI tract. AORTA: No acute finding. No abdominal aortic aneurysm. No dissection. CELIAC TRUNK: High-grade stenosis at the origin of the celiac artery. SUPERIOR MESEMTRIC ARTERY: Atherosclerotic changes are present in the proximal SMA without focal stenosis. INFERIOR MESENTERIC ARTERY: Atherosclerotic calcifications are present at the IMA origin without significant stenosis. RENAL ARTERIES: Atherosclerotic calcifications are present at the renal artery origins bilaterally. High-grade stenosis is present on the left. ILIAC ARTERIES: Moderate stenosis is present in the right internal iliac artery. Atherosclerotic calcifications are present in the femoral arteries bilaterally without focal stenosis. ABDOMEN/PELVIS: LOWER CHEST: Bilateral bronchiectasis is noted. Peripheral honeycombing is present at the lung bases, right greater than left. LIVER: Cholecystectomy is noted. GALLBLADDER AND BILE DUCTS: Cholecystectomy is noted. SPLEEN: The spleen is unremarkable. PANCREAS: The pancreas is unremarkable. ADRENAL GLANDS: Bilateral adrenal glands demonstrate no acute abnormality. KIDNEYS, URETERS AND BLADDER: A 4.5 cm simple cyst is present at the upper pole of the left kidney. A 2.0 cm simple cyst is present at the lower pole of the right kidney. No stones in the kidneys or ureters. No hydronephrosis. No perinephric or periureteral stranding. Urinary bladder is unremarkable. GI AND BOWEL: A small hiatal hernia is present. Extensive diverticular changes are present throughout the colon. No focal inflammatory changes are present to suggest diverticulitis. Stomach and duodenal sweep demonstrate no acute abnormality. There is no bowel obstruction. No  abnormal bowel wall thickening or distension. No acute appendicitis. REPRODUCTIVE: Reproductive organs are unremarkable. PERITONEUM AND RETROPERITONEUM: No ascites or free air. LYMPH NODES: No lymphadenopathy. BONES AND SOFT TISSUES: Remote compression fractures. No acute soft tissue abnormality. IMPRESSION: 1. No active GI bleeding. 2. Extensive atherosclerotic calcifications in the abdominal aorta and branch vessels without aneurysm. High-grade stenosis at the origin of the celiac artery and in the left renal artery. Moderate stenosis in the right internal iliac artery. 3. Extensive diverticular changes throughout the colon without evidence of diverticulitis. Electronically signed by: Audree Leas MD 08/21/2023 01:02 PM EDT RP Workstation: [id number]    Assessment and Plan  STEPHANIEMARIE Herring is a 83 y.o. female with medical history significant of dementia, hypertension, CKD stage IIIa, B12 deficiency comes in the emergency room from [facility] with dark maroon/burgundy colored stool.   G.I. bleed--likely diverticular acute on chronic anemia -- patient came in with maroon stools. No vomiting. Does not take any blood thinners or NSAID -- IV Protonix BID--EGD negative--d/c PPI -- G.I. consultation with Dr. Baldomero Bone. Plans for EGD. -- Monitor H&H and transfuse as needed -- hemoglobin 10.3 (was 11.3 few months ago) -- patient denies any pain and is asymptomatic. -- CT abdomen shows diverticular disease and significant atherosclerosis of intra-abdominal vessels -- transfuse as needed spoke with patient's daughter and POA Shelvy Dickens --EGD--negative --hbg went down to 7.5-- 1 unit BT--8.3--8.0 --Colonoscopy tomorrow   CKD stage IIIa -- creatinine stable at 0.9 -- trickle IV fluid   Dementia-- moderate without behavioral disturbance -- will resume meds once GI w/u completed -- patient follows with neurology Dr. Walden Guise as outpatient   Hypertension -- hold BP meds today    Procedures:  EGD Family communication :none today Consults : G.I. CODE STATUS: full DVT Prophylaxis : SCD Level of care: Med-Surg  Patient received one unit blood transfusion for G.I. bleed. She is scheduled for colonoscopy tomorrow  TOTAL TIME TAKING CARE OF THIS PATIENT: 40 minutes.  >50% time spent on counselling and coordination of care  Note: This dictation was prepared with Dragon dictation along with smaller phrase technology. Any transcriptional errors that result from this process are unintentional.  Melvinia Stager M.D    [facility]   CC: Primary care physician; Little Riff, MD

## 2023-08-22 NOTE — Progress Notes (Signed)
 Patient tolerated the bowel prep with minimal difficulty. She had one episode of emesis around 1445, but was able to finish the rest of the prep without further nausea or vomiting.

## 2023-08-22 NOTE — Anesthesia Preprocedure Evaluation (Addendum)
 Anesthesia Evaluation  Patient identified by MRN, date of birth, ID band Patient awake    Reviewed: Allergy & Precautions, NPO status , Patient's Chart, lab work & pertinent test results  History of Anesthesia Complications Negative for: history of anesthetic complications  Airway Mallampati: III  TM Distance: >3 FB Neck ROM: full    Dental  (+) Lower Dentures, Upper Dentures   Pulmonary asthma    Pulmonary exam normal        Cardiovascular hypertension, On Medications Normal cardiovascular exam     Neuro/Psych  Headaches PSYCHIATRIC DISORDERS     Dementia  Neuromuscular disease    GI/Hepatic negative GI ROS, Neg liver ROS,,,  Endo/Other  negative endocrine ROS    Renal/GU Renal disease  negative genitourinary   Musculoskeletal   Abdominal   Peds  Hematology  (+) Blood dyscrasia, anemia   Anesthesia Other Findings Past Medical History: No date: Asthma No date: Hypertension  Past Surgical History: 01/20/2018: ANTERIOR VITRECTOMY; Right     Comment:  Procedure: ANTERIOR VITRECTOMY;  Surgeon: Ola Berger,              MD;  Location: [facility] ORS;  Service: Ophthalmology;                Laterality: Right; 01/20/2018: CATARACT EXTRACTION W/PHACO; Right     Comment:  Procedure: CATARACT EXTRACTION PHACO AND INTRAOCULAR               LENS PLACEMENT (IOC);  Surgeon: Ola Berger, MD;                Location: [facility] ORS;  Service: Ophthalmology;  Laterality:              Right;  Lot# [id number] H US : 01:49.4 CDE: 21.64 No date: CHOLECYSTECTOMY  BMI    Body Mass Index: 23.39 kg/m      Reproductive/Obstetrics negative OB ROS                             Anesthesia Physical Anesthesia Plan  ASA: 3  Anesthesia Plan: General   Post-op Pain Management: Minimal or no pain anticipated   Induction: Intravenous  PONV Risk Score and Plan: 2 and Propofol infusion and TIVA  Airway Management  Planned: Natural Airway and Nasal Cannula  Additional Equipment:   Intra-op Plan:   Post-operative Plan:   Informed Consent: I have reviewed the patients History and Physical, chart, labs and discussed the procedure including the risks, benefits and alternatives for the proposed anesthesia with the patient or authorized representative who has indicated his/her understanding and acceptance.     Dental Advisory Given and Consent reviewed with POA  Plan Discussed with: Anesthesiologist, CRNA and Surgeon  Anesthesia Plan Comments: (Patient consented for risks of anesthesia including but not limited to:  - adverse reactions to medications - risk of airway placement if required - damage to eyes, teeth, lips or other oral mucosa - nerve damage due to positioning  - sore throat or hoarseness - Damage to heart, brain, nerves, lungs, other parts of body or loss of life  Patient voiced understanding and assent.)        Anesthesia Quick Evaluation

## 2023-08-22 NOTE — Anesthesia Postprocedure Evaluation (Signed)
 Anesthesia Post Note  Patient: Helen Herring  Procedure(s) Performed: EGD (ESOPHAGOGASTRODUODENOSCOPY)  Patient location during evaluation: Endoscopy Anesthesia Type: General Level of consciousness: awake and alert Pain management: pain level controlled Vital Signs Assessment: post-procedure vital signs reviewed and stable Respiratory status: spontaneous breathing, nonlabored ventilation, respiratory function stable and patient connected to nasal cannula oxygen Cardiovascular status: blood pressure returned to baseline and stable Postop Assessment: no apparent nausea or vomiting Anesthetic complications: no   No notable events documented.   Last Vitals:  Vitals:   08/22/23 1052 08/22/23 1102  BP: 117/63 128/70  Pulse: (!) 102 (!) 118  Resp: 14 14  Temp:    SpO2: 95% 95%    Last Pain:  Vitals:   08/22/23 1102  TempSrc:   PainSc: 0-No pain                 Nancey Awkward

## 2023-08-23 ENCOUNTER — Observation Stay: Admitting: Certified Registered"

## 2023-08-23 ENCOUNTER — Encounter
Admission: EM | Disposition: A | Discharge status: Home / Self Care | Payer: Self-pay | Source: Skilled Nursing Facility | Attending: Internal Medicine

## 2023-08-23 ENCOUNTER — Encounter: Payer: Self-pay | Admitting: Internal Medicine

## 2023-08-23 ENCOUNTER — Ambulatory Visit: Admit: 2023-08-23 | Admitting: Gastroenterology

## 2023-08-23 DIAGNOSIS — K922 Gastrointestinal hemorrhage, unspecified: Secondary | ICD-10-CM | POA: Diagnosis not present

## 2023-08-23 DIAGNOSIS — K921 Melena: Secondary | ICD-10-CM

## 2023-08-23 DIAGNOSIS — F0390 Unspecified dementia without behavioral disturbance: Secondary | ICD-10-CM | POA: Diagnosis not present

## 2023-08-23 DIAGNOSIS — I10 Essential (primary) hypertension: Secondary | ICD-10-CM | POA: Diagnosis not present

## 2023-08-23 DIAGNOSIS — D62 Acute posthemorrhagic anemia: Secondary | ICD-10-CM | POA: Diagnosis not present

## 2023-08-23 HISTORY — PX: COLONOSCOPY: SHX5424

## 2023-08-23 HISTORY — PX: GIVENS CAPSULE STUDY: SHX5432

## 2023-08-23 SURGERY — COLONOSCOPY
Anesthesia: General

## 2023-08-23 MED ORDER — PEG 3350-KCL-NA BICARB-NACL 420 G PO SOLR
4000.0000 mL | Freq: Once | ORAL | Status: AC
  Filled 2023-08-23: qty 4000

## 2023-08-23 MED ORDER — SODIUM CHLORIDE 0.9 % IV SOLN: INTRAVENOUS | Status: DC | PRN

## 2023-08-23 NOTE — Anesthesia Postprocedure Evaluation (Signed)
 Anesthesia Post Note  Patient: Helen Herring  Procedure(s) Performed: COLONOSCOPY IMAGING PROCEDURE, GI TRACT, INTRALUMINAL, VIA CAPSULE  Patient location during evaluation: Endoscopy Anesthesia Type: General Level of consciousness: awake and alert Pain management: pain level controlled Vital Signs Assessment: post-procedure vital signs reviewed and stable Respiratory status: spontaneous breathing, nonlabored ventilation, respiratory function stable and patient connected to nasal cannula oxygen Cardiovascular status: blood pressure returned to baseline and stable Postop Assessment: no apparent nausea or vomiting Anesthetic complications: no   No notable events documented.   Last Vitals:  Vitals:   08/23/23 0409 08/23/23 0748  BP: (!) 106/47 (!) 103/48  Pulse: 89 88  Resp: 18 18  Temp: 37.1 C 37.3 C  SpO2: 95% 98%    Last Pain:  Vitals:   08/23/23 0725  TempSrc:   PainSc: 0-No pain                 Vanice Genre

## 2023-08-23 NOTE — Progress Notes (Addendum)
 [facility]  - [place] at [facility]   PATIENT NAME: Helen Herring    MR#:  [id number]  DATE OF BIRTH:  [birth year]  SUBJECTIVE:  no family at bedside.  Spoke with [name] on the phone. Colonoscopy was canceled due to poor prep. Patient is trying to take another round of prep. Per RN mobility stool noted. Brownish stool during prep   VITALS:  Blood pressure (!) 103/48, pulse 88, temperature 99.2 F (37.3 C), resp. rate 18, height 5' 3 (1.6 m), weight 59.9 kg, SpO2 98%.  PHYSICAL EXAMINATION:   GENERAL:  83 y.o.-year-old patient with no acute distress.  LUNGS: Normal breath sounds bilaterally, no wheezing CARDIOVASCULAR: S1, S2 normal. No murmur   ABDOMEN: Soft, nontender, nondistended. EXTREMITIES: No  edema b/l.    NEUROLOGIC: nonfocal  patient is alert and awake, dementia at baseline  LABORATORY PANEL:  CBC Recent Labs  Lab 08/22/23 1301  WBC 18.3*  HGB 8.0*  HCT 23.9*  PLT 216    Chemistries  Recent Labs  Lab 08/21/23 1048 08/22/23 1301  NA 138 138  K 4.1 4.1  CL 106 110  CO2 25 20*  GLUCOSE 122* 104*  BUN 26* 29*  CREATININE 0.90 0.74  CALCIUM 8.8* 7.9*  AST 15  --   ALT 9  --   ALKPHOS 62  --   BILITOT 0.6  --    Cardiac Enzymes No results for input(s): TROPONINI in the last 168 hours. RADIOLOGY:  No results found.   Assessment and Plan  Unnamed Helen Herring is a 83 y.o. female with medical history significant of dementia, hypertension, CKD stage IIIa, B12 deficiency comes in the emergency room from [facility] with dark maroon/burgundy colored stool.   G.I. bleed--likely diverticular acute on chronic anemia -- patient came in with maroon stools. No vomiting. Does not take any blood thinners or NSAID -- IV Protonix BID--EGD negative--d/c PPI -- G.I. consultation with Dr. Baldomero Bone. Plans for EGD. -- Monitor H&H and transfuse as needed -- hemoglobin 10.3 (was 11.3 few months ago) -- patient denies any pain and is  asymptomatic. -- CT abdomen shows diverticular disease and significant atherosclerosis of intra-abdominal vessels -- transfuse as needed spoke with patient's daughter and POA Helen Herring --EGD--negative --hbg went down to 7.5-- 1 unit BT--8.3--8.0 --Colonoscopy tomorrow due to poor prep today   CKD stage IIIa -- creatinine stable at 0.9   Dementia-- moderate without behavioral disturbance -- will resume meds once GI w/u completed -- patient follows with neurology Dr. Walden Herring as outpatient   Hypertension -- hold BP meds today    Procedures: EGD Family communication : [name] Consults : G.I. CODE STATUS: full DVT Prophylaxis : SCD Level of care: Med-Surg  Patient received one unit blood transfusion for G.I. bleed. She is scheduled for colonoscopy tomorrow  TOTAL TIME TAKING CARE OF THIS PATIENT: 30 minutes.  >50% time spent on counselling and coordination of care  Note: This dictation was prepared with Dragon dictation along with smaller phrase technology. Any transcriptional errors that result from this process are unintentional.  Helen Herring M.D    [facility]   CC: Primary care physician; Helen Riff, MD

## 2023-08-23 NOTE — Op Note (Signed)
 [facility] Gastroenterology Patient Name: Helen Herring Procedure Date: 08/23/2023 8:32 AM MRN: [id number] Account #: [id number] Date of Birth: 09/13/1940 Admit Type: Inpatient Age: 83 Room: [facility] ENDO ROOM 4 Gender: Female Note Status: Finalized Instrument Name: Charlyn Cooley [id number] Procedure:             Colonoscopy Indications:           Hematochezia Providers:             Marnee Sink MD, MD Referring MD:          Little Riff, MD (Referring MD) Medicines:             None Complications:         No immediate complications. Procedure:             Pre-Anesthesia Assessment:                        - Prior to the procedure, a History and Physical was                         performed, and patient medications and allergies were                         reviewed. The patient's tolerance of previous                         anesthesia was also reviewed. The risks and benefits                         of the procedure and the sedation options and risks                         were discussed with the patient. All questions were                         answered, and informed consent was obtained. Prior                         Anticoagulants: The patient has taken no anticoagulant                         or antiplatelet agents. ASA Grade Assessment: II - A                         patient with mild systemic disease. After reviewing                         the risks and benefits, the patient was deemed in                         satisfactory condition to undergo the procedure.                        After obtaining informed consent, the colonoscope was                         passed under direct vision. Throughout the procedure,  the patient's blood pressure, pulse, and oxygen                         saturations were monitored continuously. The                         Colonoscope was introduced through the anus with the                          intention of advancing to the cecum. The scope was                         advanced to the sigmoid colon before the procedure was                         aborted. Medications were not given. The colonoscopy                         was performed without difficulty. The patient                         tolerated the procedure well. The quality of the bowel                         preparation was not adequate to identify polyps                         greater than 5 mm in size. The colonoscopy was aborted                         due to poor bowel prep with stool present. Findings:      The perianal and digital rectal examinations were normal.      Copious quantities of stool was found in the rectum and in the       recto-sigmoid colon, precluding visualization. Impression:            - Preparation of the colon was inadequate.                        - The procedure was aborted due to poor bowel prep                         with stool present.                        - Stool in the rectum and in the recto-sigmoid colon.                        - No specimens collected. Recommendation:        - Return patient to hospital ward for ongoing care.                        - Clear liquid diet.                        - Repeat colonoscopy tomorrow because the bowel  preparation was poor. Procedure Code(s):     --- Professional ---                        [phone], 53, Colonoscopy, flexible; diagnostic,                         including collection of specimen(s) by brushing or                         washing, when performed (separate procedure) Diagnosis Code(s):     --- Professional ---                        K92.1, Melena (includes Hematochezia) CPT copyright 2022 American Medical Association. All rights reserved. The codes documented in this report are preliminary and upon coder review may  be revised to meet current compliance requirements. Marnee Sink MD, MD 08/23/2023 9:11:35  AM This report has been signed electronically. Number of Addenda: 0 Note Initiated On: 08/23/2023 8:32 AM Estimated Blood Loss:  Estimated blood loss: none.      [facility]

## 2023-08-23 NOTE — Plan of Care (Signed)
  Problem: Education: Goal: Knowledge of General Education information will improve Description: Including pain rating scale, medication(s)/side effects and non-pharmacologic comfort measures Outcome: Progressing   Problem: Health Behavior/Discharge Planning: Goal: Ability to manage health-related needs will improve Outcome: Progressing   Problem: Clinical Measurements: Goal: Ability to maintain clinical measurements within normal limits will improve Outcome: Progressing Goal: Will remain free from infection Outcome: Progressing Goal: Diagnostic test results will improve Outcome: Progressing Goal: Respiratory complications will improve Outcome: Progressing Goal: Cardiovascular complication will be avoided Outcome: Progressing   Problem: Activity: Goal: Risk for activity intolerance will decrease Outcome: Progressing   Problem: Nutrition: Goal: Adequate nutrition will be maintained Outcome: Progressing   Problem: Coping: Goal: Level of anxiety will decrease Outcome: Progressing   Problem: Elimination: Goal: Will not experience complications related to bowel motility Outcome: Progressing Goal: Will not experience complications related to urinary retention Outcome: Progressing   Problem: Pain Managment: Goal: General experience of comfort will improve and/or be controlled Outcome: Progressing   Problem: Skin Integrity: Goal: Risk for impaired skin integrity will decrease Outcome: Progressing   Problem: Skin Integrity: Goal: Risk for impaired skin integrity will decrease Outcome: Progressing

## 2023-08-23 NOTE — Transfer of Care (Signed)
 Immediate Anesthesia Transfer of Care Note  Patient: Helen Herring  Procedure(s) Performed: COLONOSCOPY IMAGING PROCEDURE, GI TRACT, INTRALUMINAL, VIA CAPSULE  Patient Location: endo  Anesthesia Type:none case canceled  Level of Consciousness: awake  Airway & Oxygen Therapy: Patient Spontanous Breathing  Post-op Assessment: Report given to RN  Post vital signs: stable  Last Vitals:  Vitals Value Taken Time  BP    Temp    Pulse    Resp    SpO2      Last Pain:  Vitals:   08/23/23 0409  TempSrc: Oral  PainSc:          Complications: No notable events documented.

## 2023-08-23 NOTE — Anesthesia Preprocedure Evaluation (Signed)
 Anesthesia Evaluation  Patient identified by MRN, date of birth, ID band Patient awake    Reviewed: Allergy & Precautions, NPO status , Patient's Chart, lab work & pertinent test results  History of Anesthesia Complications Negative for: history of anesthetic complications  Airway Mallampati: III  TM Distance: >3 FB Neck ROM: full    Dental  (+) Lower Dentures, Upper Dentures, Dental Advidsory Given   Pulmonary neg shortness of breath, asthma , neg COPD, neg recent URI   Pulmonary exam normal        Cardiovascular hypertension, On Medications (-) angina (-) Past MI and (-) Cardiac Stents Normal cardiovascular exam(-) dysrhythmias (-) Valvular Problems/Murmurs     Neuro/Psych  Headaches, neg Seizures PSYCHIATRIC DISORDERS     Dementia  Neuromuscular disease    GI/Hepatic negative GI ROS, Neg liver ROS,,,  Endo/Other  negative endocrine ROS    Renal/GU Renal disease  negative genitourinary   Musculoskeletal   Abdominal   Peds  Hematology  (+) Blood dyscrasia, anemia   Anesthesia Other Findings Past Medical History: No date: Asthma No date: Hypertension  Past Surgical History: 01/20/2018: ANTERIOR VITRECTOMY; Right     Comment:  Procedure: ANTERIOR VITRECTOMY;  Surgeon: Ola Berger,              MD;  Location: [facility] ORS;  Service: Ophthalmology;                Laterality: Right; 01/20/2018: CATARACT EXTRACTION W/PHACO; Right     Comment:  Procedure: CATARACT EXTRACTION PHACO AND INTRAOCULAR               LENS PLACEMENT (IOC);  Surgeon: Ola Berger, MD;                Location: [facility] ORS;  Service: Ophthalmology;  Laterality:              Right;  Lot# [id number] H US : 01:49.4 CDE: 21.64 No date: CHOLECYSTECTOMY  BMI    Body Mass Index: 23.39 kg/m      Reproductive/Obstetrics negative OB ROS                             Anesthesia Physical Anesthesia Plan  ASA: 3  Anesthesia  Plan: General   Post-op Pain Management: Minimal or no pain anticipated   Induction: Intravenous  PONV Risk Score and Plan: 3 and Propofol infusion and TIVA  Airway Management Planned: Natural Airway and Nasal Cannula  Additional Equipment:   Intra-op Plan:   Post-operative Plan:   Informed Consent: I have reviewed the patients History and Physical, chart, labs and discussed the procedure including the risks, benefits and alternatives for the proposed anesthesia with the patient or authorized representative who has indicated his/her understanding and acceptance.     Dental Advisory Given and Consent reviewed with POA  Plan Discussed with: Anesthesiologist, CRNA and Surgeon  Anesthesia Plan Comments: (Patient consented for risks of anesthesia including but not limited to:  - adverse reactions to medications - risk of airway placement if required - damage to eyes, teeth, lips or other oral mucosa - nerve damage due to positioning  - sore throat or hoarseness - Damage to heart, brain, nerves, lungs, other parts of body or loss of life  Patient voiced understanding and assent.)        Anesthesia Quick Evaluation

## 2023-08-23 NOTE — Plan of Care (Signed)
  Problem: Education: Goal: Knowledge of General Education information will improve Description: Including pain rating scale, medication(s)/side effects and non-pharmacologic comfort measures Outcome: Progressing   Problem: Clinical Measurements: Goal: Ability to maintain clinical measurements within normal limits will improve Outcome: Progressing   Problem: Nutrition: Goal: Adequate nutrition will be maintained Outcome: Progressing   Problem: Elimination: Goal: Will not experience complications related to bowel motility Outcome: Progressing   Problem: Safety: Goal: Ability to remain free from injury will improve Outcome: Progressing   Problem: Skin Integrity: Goal: Risk for impaired skin integrity will decrease Outcome: Progressing

## 2023-08-23 NOTE — OR Nursing (Signed)
 PT ARRIVED TO SURGICAL ROOM WITH DRIED STOOL,BLACK. DR WALL CHECKED PT PER RECTUM . PT FULL OF STOOL. WILL NEED TO BE RE-PREPPED. WILL DISCHARGE BACK TO UNIT

## 2023-08-24 ENCOUNTER — Encounter: Payer: Self-pay | Admitting: Internal Medicine

## 2023-08-24 DIAGNOSIS — D62 Acute posthemorrhagic anemia: Secondary | ICD-10-CM | POA: Diagnosis not present

## 2023-08-24 DIAGNOSIS — I10 Essential (primary) hypertension: Secondary | ICD-10-CM | POA: Diagnosis not present

## 2023-08-24 DIAGNOSIS — F0390 Unspecified dementia without behavioral disturbance: Secondary | ICD-10-CM | POA: Diagnosis not present

## 2023-08-24 DIAGNOSIS — K922 Gastrointestinal hemorrhage, unspecified: Secondary | ICD-10-CM | POA: Diagnosis not present

## 2023-08-24 LAB — CBC
HCT: 16 % — ABNORMAL LOW (ref 36.0–46.0)
Hemoglobin: 5.3 g/dL — ABNORMAL LOW (ref 12.0–15.0)
MCH: 32.5 pg (ref 26.0–34.0)
MCHC: 33.1 g/dL (ref 30.0–36.0)
MCV: 98.2 fL (ref 80.0–100.0)
Platelets: 172 10*3/uL (ref 150–400)
RBC: 1.63 MIL/uL — ABNORMAL LOW (ref 3.87–5.11)
RDW: 14.7 % (ref 11.5–15.5)
WBC: 12.2 10*3/uL — ABNORMAL HIGH (ref 4.0–10.5)
nRBC: 0.5 % — ABNORMAL HIGH (ref 0.0–0.2)

## 2023-08-24 LAB — HEMOGLOBIN: Hemoglobin: 9.8 g/dL — ABNORMAL LOW (ref 12.0–15.0)

## 2023-08-24 MED ORDER — MAGNESIUM CITRATE PO SOLN
1.0000 | Freq: Two times a day (BID) | ORAL | Status: AC
  Administered 2023-08-24 (×2): 1 via ORAL
  Filled 2023-08-24 (×2): qty 296

## 2023-08-24 MED ORDER — SODIUM CHLORIDE 0.9 % IV SOLN: Freq: Once | INTRAVENOUS | Status: AC

## 2023-08-24 MED ORDER — SODIUM CHLORIDE 0.9% IV SOLUTION: Freq: Once | INTRAVENOUS | Status: AC

## 2023-08-24 NOTE — Plan of Care (Signed)
  Problem: Education: Goal: Knowledge of General Education information will improve Description: Including pain rating scale, medication(s)/side effects and non-pharmacologic comfort measures 08/24/2023 1942 by Gideon Kussmaul, RN Outcome: Progressing 08/24/2023 1942 by Gideon Kussmaul, RN Outcome: Progressing   Problem: Health Behavior/Discharge Planning: Goal: Ability to manage health-related needs will improve 08/24/2023 1942 by Gideon Kussmaul, RN Outcome: Progressing 08/24/2023 1942 by Gideon Kussmaul, RN Outcome: Progressing   Problem: Clinical Measurements: Goal: Ability to maintain clinical measurements within normal limits will improve 08/24/2023 1942 by Gideon Kussmaul, RN Outcome: Progressing 08/24/2023 1942 by Gideon Kussmaul, RN Outcome: Progressing Goal: Will remain free from infection 08/24/2023 1942 by Gideon Kussmaul, RN Outcome: Progressing 08/24/2023 1942 by Gideon Kussmaul, RN Outcome: Progressing Goal: Diagnostic test results will improve 08/24/2023 1942 by Gideon Kussmaul, RN Outcome: Progressing 08/24/2023 1942 by Gideon Kussmaul, RN Outcome: Progressing Goal: Respiratory complications will improve 08/24/2023 1942 by Gideon Kussmaul, RN Outcome: Progressing 08/24/2023 1942 by Gideon Kussmaul, RN Outcome: Progressing Goal: Cardiovascular complication will be avoided 08/24/2023 1942 by Gideon Kussmaul, RN Outcome: Progressing 08/24/2023 1942 by Gideon Kussmaul, RN Outcome: Progressing   Problem: Activity: Goal: Risk for activity intolerance will decrease 08/24/2023 1942 by Gideon Kussmaul, RN Outcome: Progressing 08/24/2023 1942 by Gideon Kussmaul, RN Outcome: Progressing   Problem: Coping: Goal: Level of anxiety will decrease 08/24/2023 1942 by Gideon Kussmaul, RN Outcome:  Progressing 08/24/2023 1942 by Gideon Kussmaul, RN Outcome: Progressing   Problem: Elimination: Goal: Will not experience complications related to bowel motility 08/24/2023 1942 by Gideon Kussmaul, RN Outcome: Progressing 08/24/2023 1942 by Gideon Kussmaul, RN Outcome: Progressing Goal: Will not experience complications related to urinary retention 08/24/2023 1942 by Gideon Kussmaul, RN Outcome: Progressing 08/24/2023 1942 by Gideon Kussmaul, RN Outcome: Progressing

## 2023-08-24 NOTE — Progress Notes (Addendum)
 [facility]  - [place] at [facility]   PATIENT NAME: Helen Herring    MR#:  [id number]  DATE OF BIRTH:  Jan 21, 1941  SUBJECTIVE:   Spoke with [name] in the room today. Patient is colonoscopy was canceled since morning hemoglobin was found to be 5.3. There was some bloody  return mixed with BM reported by RN. Patient is asymptomatic.  VITALS:  Blood pressure 129/62, pulse 92, temperature 98.8 F (37.1 C), temperature source Oral, resp. rate 16, height 5' 3 (1.6 m), weight 59.9 kg, SpO2 95%.  PHYSICAL EXAMINATION:   GENERAL:  83 y.o.-year-old patient with no acute distress.  LUNGS: Normal breath sounds bilaterally, no wheezing CARDIOVASCULAR: S1, S2 normal. No murmur   ABDOMEN: Soft, nontender, nondistended. EXTREMITIES: No  edema b/l.    NEUROLOGIC: nonfocal  patient is alert and awake, dementia at baseline  LABORATORY PANEL:  CBC Recent Labs  Lab 08/24/23 0901  WBC 12.2*  HGB 5.3*  HCT 16.0*  PLT 172    Chemistries  Recent Labs  Lab 08/21/23 1048 08/22/23 1301  NA 138 138  K 4.1 4.1  CL 106 110  CO2 25 20*  GLUCOSE 122* 104*  BUN 26* 29*  CREATININE 0.90 0.74  CALCIUM 8.8* 7.9*  AST 15  --   ALT 9  --   ALKPHOS 62  --   BILITOT 0.6  --      Assessment and Plan  Helen Herring is a 83 y.o. female with medical history significant of dementia, hypertension, CKD stage IIIa, B12 deficiency comes in the emergency room from [place] [facility] with dark maroon/burgundy colored stool.   G.I. bleed--likely diverticular acute on chronic anemia -- patient came in with maroon stools. No vomiting. Does not take any blood thinners or NSAID -- IV Protonix BID--EGD negative--d/c PPI -- G.I. consultation with Dr. Baldomero Bone. Plans for EGD. -- Monitor H&H and transfuse as needed -- hemoglobin 10.3 (was 11.3 few months ago) -- patient denies any pain and is asymptomatic. -- CT abdomen shows diverticular disease and significant atherosclerosis of  intra-abdominal vessels -- transfuse as needed spoke with patient's daughter and POA Helen Herring --EGD--negative --hbg went down to 7.5-- 1 unit BT--8.3--8.0-- 5.3-- two unit blood transfusion --Colonoscopy tomorrow due to low hemoglobin   CKD stage IIIa -- creatinine stable at 0.9   Dementia-- moderate without behavioral disturbance -- will resume meds once GI w/u completed -- patient follows with neurology Dr. Walden Herring as outpatient   Hypertension -- hold BP meds today    Procedures: EGD Family communication : [name] Consults : G.I. CODE STATUS: full DVT Prophylaxis : SCD Level of care: Med-Surg  Patient received one unit blood transfusion for G.I. bleed. She is scheduled for colonoscopy tomorrow  TOTAL TIME TAKING CARE OF THIS PATIENT: 30 minutes.  >50% time spent on counselling and coordination of care  Note: This dictation was prepared with Dragon dictation along with smaller phrase technology. Any transcriptional errors that result from this process are unintentional.  Helen Herring M.D    [facility]   CC: Primary care physician; Little Riff, MD

## 2023-08-24 NOTE — Progress Notes (Signed)
 Mobility Specialist - Progress Note   08/24/23 1538  Mobility  Activity Contraindicated/medical hold     Per chart review, pt's Hgb 5.3. Pt receiving blood at this time. Will attempt another date/time as appropriate.    Searcy Czech Mobility Specialist 08/24/23, 3:39 PM

## 2023-08-24 NOTE — Progress Notes (Signed)
  Hgb from labs drawn this AM resulted, showing severe anemia. Discussed with Dr Ole Berkeley (GI), agreed to postpone procedure.

## 2023-08-24 NOTE — Anesthesia Preprocedure Evaluation (Signed)
 Anesthesia Evaluation  Patient identified by MRN, date of birth, ID band Patient awake    Reviewed: Allergy & Precautions, NPO status , Patient's Chart, lab work & pertinent test results  History of Anesthesia Complications Negative for: history of anesthetic complications  Airway Mallampati: II  TM Distance: >3 FB Neck ROM: Full    Dental  (+) Edentulous Upper, Edentulous Lower   Pulmonary asthma , neg sleep apnea, neg COPD, Patient abstained from smoking.Not current smoker   Pulmonary exam normal breath sounds clear to auscultation       Cardiovascular Exercise Tolerance: Good METShypertension, Pt. on medications (-) CAD and (-) Past MI (-) dysrhythmias  Rhythm:Regular Rate:Normal - Systolic murmurs    Neuro/Psych  Headaches PSYCHIATRIC DISORDERS     Dementia    GI/Hepatic ,neg GERD  ,,(+)     (-) substance abuse    Endo/Other  neg diabetes    Renal/GU negative Renal ROS     Musculoskeletal   Abdominal   Peds  Hematology  (+) Blood dyscrasia, anemia   Anesthesia Other Findings Past Medical History: No date: Asthma No date: Hypertension  Reproductive/Obstetrics                             Anesthesia Physical Anesthesia Plan  ASA: 3  Anesthesia Plan: General   Post-op Pain Management: Minimal or no pain anticipated   Induction: Intravenous  PONV Risk Score and Plan: 3 and Propofol infusion, TIVA and Ondansetron   Airway Management Planned: Nasal Cannula  Additional Equipment: None  Intra-op Plan:   Post-operative Plan:   Informed Consent: I have reviewed the patients History and Physical, chart, labs and discussed the procedure including the risks, benefits and alternatives for the proposed anesthesia with the patient or authorized representative who has indicated his/her understanding and acceptance.     Dental advisory given  Plan Discussed with: CRNA and  Surgeon  Anesthesia Plan Comments: (Discussed risks of anesthesia with patient and daughter/POA at bedside, including possibility of difficulty with spontaneous ventilation under anesthesia necessitating airway intervention, PONV, and rare risks such as cardiac or respiratory or neurological events, and allergic reactions. Discussed the role of CRNA in patient's perioperative care. Patient understands.)       Anesthesia Quick Evaluation

## 2023-08-24 NOTE — Plan of Care (Signed)

## 2023-08-25 ENCOUNTER — Encounter: Payer: Self-pay | Admitting: Internal Medicine

## 2023-08-25 ENCOUNTER — Observation Stay: Admitting: Anesthesiology

## 2023-08-25 ENCOUNTER — Encounter
Admission: EM | Disposition: A | Discharge status: Home / Self Care | Payer: Self-pay | Source: Skilled Nursing Facility | Attending: Internal Medicine

## 2023-08-25 DIAGNOSIS — K641 Second degree hemorrhoids: Secondary | ICD-10-CM | POA: Diagnosis present

## 2023-08-25 DIAGNOSIS — Z9841 Cataract extraction status, right eye: Secondary | ICD-10-CM | POA: Diagnosis not present

## 2023-08-25 DIAGNOSIS — W19XXXA Unspecified fall, initial encounter: Secondary | ICD-10-CM | POA: Diagnosis present

## 2023-08-25 DIAGNOSIS — K5730 Diverticulosis of large intestine without perforation or abscess without bleeding: Secondary | ICD-10-CM

## 2023-08-25 DIAGNOSIS — J45909 Unspecified asthma, uncomplicated: Secondary | ICD-10-CM | POA: Diagnosis present

## 2023-08-25 DIAGNOSIS — F0390 Unspecified dementia without behavioral disturbance: Secondary | ICD-10-CM | POA: Diagnosis not present

## 2023-08-25 DIAGNOSIS — D122 Benign neoplasm of ascending colon: Secondary | ICD-10-CM

## 2023-08-25 DIAGNOSIS — E538 Deficiency of other specified B group vitamins: Secondary | ICD-10-CM | POA: Diagnosis present

## 2023-08-25 DIAGNOSIS — K5731 Diverticulosis of large intestine without perforation or abscess with bleeding: Secondary | ICD-10-CM | POA: Diagnosis present

## 2023-08-25 DIAGNOSIS — K635 Polyp of colon: Secondary | ICD-10-CM

## 2023-08-25 DIAGNOSIS — Z79899 Other long term (current) drug therapy: Secondary | ICD-10-CM | POA: Diagnosis not present

## 2023-08-25 DIAGNOSIS — I129 Hypertensive chronic kidney disease with stage 1 through stage 4 chronic kidney disease, or unspecified chronic kidney disease: Secondary | ICD-10-CM | POA: Diagnosis present

## 2023-08-25 DIAGNOSIS — Z9049 Acquired absence of other specified parts of digestive tract: Secondary | ICD-10-CM | POA: Diagnosis not present

## 2023-08-25 DIAGNOSIS — N1831 Chronic kidney disease, stage 3a: Secondary | ICD-10-CM | POA: Diagnosis present

## 2023-08-25 DIAGNOSIS — R944 Abnormal results of kidney function studies: Secondary | ICD-10-CM | POA: Diagnosis present

## 2023-08-25 DIAGNOSIS — K922 Gastrointestinal hemorrhage, unspecified: Principal | ICD-10-CM

## 2023-08-25 DIAGNOSIS — Z961 Presence of intraocular lens: Secondary | ICD-10-CM | POA: Diagnosis present

## 2023-08-25 DIAGNOSIS — D62 Acute posthemorrhagic anemia: Secondary | ICD-10-CM | POA: Diagnosis present

## 2023-08-25 DIAGNOSIS — I771 Stricture of artery: Secondary | ICD-10-CM | POA: Diagnosis present

## 2023-08-25 DIAGNOSIS — F03A0 Unspecified dementia, mild, without behavioral disturbance, psychotic disturbance, mood disturbance, and anxiety: Secondary | ICD-10-CM | POA: Diagnosis present

## 2023-08-25 DIAGNOSIS — I10 Essential (primary) hypertension: Secondary | ICD-10-CM | POA: Diagnosis not present

## 2023-08-25 HISTORY — PX: POLYPECTOMY: SHX149

## 2023-08-25 HISTORY — PX: COLONOSCOPY: SHX5424

## 2023-08-25 LAB — TYPE AND SCREEN
ABO/RH(D): A POS
Antibody Screen: NEGATIVE
Unit division: 0
Unit division: 0
Unit division: 0
Unit division: 0
Unit division: 0

## 2023-08-25 LAB — BPAM RBC
Blood Product Expiration Date: 202507152359
Blood Product Expiration Date: 202507152359
Blood Product Expiration Date: 202507152359
Blood Product Expiration Date: 202507152359
Blood Product Expiration Date: 202507152359
ISSUE DATE / TIME: 202506150013
ISSUE DATE / TIME: 202506171409
ISSUE DATE / TIME: 202506171638
Unit Type and Rh: 202507152359
Unit Type and Rh: 6200
Unit Type and Rh: 6200
Unit Type and Rh: 6200
Unit Type and Rh: 6200
Unit Type and Rh: 6200

## 2023-08-25 SURGERY — COLONOSCOPY
Anesthesia: General

## 2023-08-25 MED ORDER — PROPOFOL 500 MG/50ML IV EMUL
INTRAVENOUS | Status: DC | PRN
  Administered 2023-08-25: 100 ug/kg/min via INTRAVENOUS

## 2023-08-25 MED ORDER — SODIUM CHLORIDE 0.9 % IV SOLN: INTRAVENOUS | Status: DC | PRN

## 2023-08-25 MED ORDER — PROPOFOL 10 MG/ML IV BOLUS
INTRAVENOUS | Status: DC | PRN
  Administered 2023-08-25: 50 mg via INTRAVENOUS

## 2023-08-25 MED ORDER — GLYCOPYRROLATE 0.2 MG/ML IJ SOLN
INTRAMUSCULAR | Status: DC | PRN
  Administered 2023-08-25: .1 mg via INTRAVENOUS

## 2023-08-25 MED ORDER — PHENYLEPHRINE 80 MCG/ML (10ML) SYRINGE FOR IV PUSH (FOR BLOOD PRESSURE SUPPORT)
PREFILLED_SYRINGE | INTRAVENOUS | Status: DC | PRN
  Administered 2023-08-25 (×2): 80 ug via INTRAVENOUS

## 2023-08-25 NOTE — Progress Notes (Signed)
 [facility]  - [place] [facility]   PATIENT NAME: Helen Herring    MR#:  [id number]  DATE OF BIRTH:  05-May-1940  SUBJECTIVE:  patient is post colonoscopy. Doing well. Spoke with [name] and updated. Hemoglobin stable at 9.8. Denies any complaints.  VITALS:  Blood pressure (!) 142/70, pulse 79, temperature 97.8 F (36.6 C), temperature source Temporal, resp. rate 16, height 5' 3 (1.6 m), weight 59.9 kg, SpO2 99%.  PHYSICAL EXAMINATION:   GENERAL:  83 y.o.-year-old patient with no acute distress.  LUNGS: Normal breath sounds bilaterally, no wheezing CARDIOVASCULAR: S1, S2 normal. No murmur   ABDOMEN: Soft, nontender, nondistended. EXTREMITIES: No  edema b/l.    NEUROLOGIC: nonfocal  patient is alert and awake, dementia at baseline  LABORATORY PANEL:  CBC Recent Labs  Lab 08/24/23 0901 08/24/23 2119  WBC 12.2*  --   HGB 5.3* 9.8*  HCT 16.0*  --   PLT 172  --     Chemistries  Recent Labs  Lab 08/21/23 1048 08/22/23 1301  NA 138 138  K 4.1 4.1  CL 106 110  CO2 25 20*  GLUCOSE 122* 104*  BUN 26* 29*  CREATININE 0.90 0.74  CALCIUM 8.8* 7.9*  AST 15  --   ALT 9  --   ALKPHOS 62  --   BILITOT 0.6  --      Assessment and Plan  Helen Herring is a 83 y.o. female with medical history significant of dementia, hypertension, CKD stage IIIa, B12 deficiency comes in the emergency room from [place] [facility] with dark maroon/burgundy colored stool.   G.I. bleed--likely diverticular acute on chronic anemia -- patient came in with maroon stools. No vomiting. Does not take any blood thinners or NSAID -- IV Protonix BID--EGD negative--d/c PPI -- G.I. consultation with Dr. Baldomero Bone. Plans for EGD. -- Monitor H&H and transfuse as needed -- hemoglobin 10.3 (was 11.3 few months ago) -- patient denies any pain and is asymptomatic. -- CT abdomen shows diverticular disease and significant atherosclerosis of intra-abdominal vessels -- transfuse as  needed spoke with patient's daughter and POA Helen Herring --EGD--negative --hbg went down to 7.5-- 1 unit BT--8.3--8.0-- 5.3-- two unit blood transfusion--9.8 -- patient did have some mixed blood with stool on colon prep. Required to unit blood transfusion. --Colonoscopy  with a small polyp not bleeding. Colon filled with diverticulosis and its the likely cause of the bleeding. If rebleeds then red tagged scan and possible angio. Nothing further to do from a GI point of view.  -- Start PO diet   CKD stage IIIa -- creatinine stable at 0.9   Dementia-- moderate without behavioral disturbance -- will resume meds once GI w/u completed -- patient follows with neurology Dr. Walden Herring as outpatient   Hypertension -- hold BP meds today    Procedures: EGD/colonoscopy Family communication : [name] on the phone Consults : G.I. CODE STATUS: full DVT Prophylaxis : SCD Level of care: Med-Surg  Monitor one more day to ensure no bleeding. Will discharge to ALF tomorrow TOTAL TIME TAKING CARE OF THIS PATIENT: 30 minutes.  >50% time spent on counselling and coordination of care  Note: This dictation was prepared with Dragon dictation along with smaller phrase technology. Any transcriptional errors that result from this process are unintentional.  Helen Herring M.D    [facility]   CC: Primary care physician; Little Riff, MD

## 2023-08-25 NOTE — Anesthesia Postprocedure Evaluation (Signed)
 Anesthesia Post Note  Patient: Helen Herring  Procedure(s) Performed: COLONOSCOPY POLYPECTOMY, INTESTINE  Patient location during evaluation: Endoscopy Anesthesia Type: General Level of consciousness: awake and alert Pain management: pain level controlled Vital Signs Assessment: post-procedure vital signs reviewed and stable Respiratory status: spontaneous breathing, nonlabored ventilation, respiratory function stable and patient connected to nasal cannula oxygen Cardiovascular status: blood pressure returned to baseline and stable Postop Assessment: no apparent nausea or vomiting Anesthetic complications: no   No notable events documented.   Last Vitals:  Vitals:   08/25/23 1203 08/25/23 1213  BP:  (!) 140/69  Pulse:    Resp:    Temp:    SpO2: 98% 100%    Last Pain:  Vitals:   08/25/23 1213  TempSrc:   PainSc: 0-No pain                 Nancey Awkward

## 2023-08-25 NOTE — Progress Notes (Signed)
 The patient had a colonoscopy today with a small polyp not bleeding. Colon filled with diverticulosis and its the likely cause of the bleeding. If rebleeds then red tagged scan and possible angio. Nothing further to do from a GI point of view.  I will sign off.  Please call if any further GI concerns or questions.  We would like to thank you for the opportunity to participate in the care of Helen Herring.

## 2023-08-25 NOTE — Transfer of Care (Signed)
 Immediate Anesthesia Transfer of Care Note  Patient: Helen Herring  Procedure(s) Performed: COLONOSCOPY POLYPECTOMY, INTESTINE  Patient Location: PACU  Anesthesia Type:General  Level of Consciousness: drowsy  Airway & Oxygen Therapy: Patient Spontanous Breathing  Post-op Assessment: Report given to RN and Patient moving all extremities  Post vital signs: Reviewed and stable  Last Vitals:  Vitals Value Taken Time  BP 99/52 08/25/23 11:55  Temp 36.6 C 08/25/23 11:53  Pulse 73 08/25/23 11:55  Resp 15 08/25/23 11:55  SpO2 100 % 08/25/23 11:55  Vitals shown include unfiled device data.  Last Pain:  Vitals:   08/25/23 1153  TempSrc: Temporal  PainSc: Asleep         Complications: No notable events documented.

## 2023-08-25 NOTE — Op Note (Signed)
 [facility] Patient Name: Helen Herring Procedure Date: 08/25/2023 11:22 AM MRN: [id number] Account #: [id number] Date of Birth: 16-May-1940 Admit Type: Outpatient Age: 83 Room: [facility] ENDO ROOM 1 Gender: Female Note Status: Finalized Instrument Name: Charlyn Cooley [id number] Procedure:             Colonoscopy Indications:           Hematochezia Providers:             Marnee Sink MD, MD Medicines:             Propofol per Anesthesia Complications:         No immediate complications. Procedure:             Pre-Anesthesia Assessment:                        - Prior to the procedure, a History and Physical was                         performed, and patient medications and allergies were                         reviewed. The patient's tolerance of previous                         anesthesia was also reviewed. The risks and benefits                         of the procedure and the sedation options and risks                         were discussed with the patient. All questions were                         answered, and informed consent was obtained. Prior                         Anticoagulants: The patient has taken no anticoagulant                         or antiplatelet agents. ASA Grade Assessment: II - A                         patient with mild systemic disease. After reviewing                         the risks and benefits, the patient was deemed in                         satisfactory condition to undergo the procedure.                        After obtaining informed consent, the colonoscope was                         passed under direct vision. Throughout the procedure,                         the patient's blood pressure, pulse, and oxygen  saturations were monitored continuously. The                         Colonoscope was introduced through the anus and                         advanced to the the cecum, identified by  appendiceal                         orifice and ileocecal valve. The colonoscopy was                         performed without difficulty. The patient tolerated                         the procedure well. The quality of the bowel                         preparation was excellent. Findings:      The perianal and digital rectal examinations were normal.      A 4 mm polyp was found in the ascending colon. The polyp was sessile.       The polyp was removed with a cold snare. Resection and retrieval were       complete.      Multiple medium-mouthed and small-mouthed diverticula were found in the       entire colon.      Non-bleeding internal hemorrhoids were found during retroflexion. The       hemorrhoids were Grade II (internal hemorrhoids that prolapse but reduce       spontaneously). Impression:            - One 4 mm polyp in the ascending colon, removed with                         a cold snare. Resected and retrieved.                        - Diverticulosis in the entire examined colon.                        - Non-bleeding internal hemorrhoids. Recommendation:        - Return patient to hospital ward for ongoing care.                        - Mechanical soft diet.                        - Continue present medications.                        - Await pathology results. Procedure Code(s):     --- Professional ---                        [phone], Colonoscopy, flexible; with removal of                         tumor(s), polyp(s), or other lesion(s) by snare  technique Diagnosis Code(s):     --- Professional ---                        K92.1, Melena (includes Hematochezia)                        D12.2, Benign neoplasm of ascending colon CPT copyright 2022 American Medical Association. All rights reserved. The codes documented in this report are preliminary and upon coder review may  be revised to meet current compliance requirements. Marnee Sink MD, MD 08/25/2023  11:51:20 AM This report has been signed electronically. Number of Addenda: 0 Note Initiated On: 08/25/2023 11:22 AM Scope Withdrawal Time: 0 hours 6 minutes 32 seconds  Total Procedure Duration: 0 hours 16 minutes 15 seconds  Estimated Blood Loss:  Estimated blood loss: none.      [facility]

## 2023-08-25 NOTE — TOC Progression Note (Signed)
 Transition of Care [facility]) - Progression Note    Patient Details  Name: JARETZI DROZ MRN: [id number] Date of Birth: 17-Mar-1940  Transition [facility]) CM/SW Contact  Odilia Bennett, LCSW Phone Number: 08/25/2023, 1:28 PM  Clinical Narrative:   Eldora Greet to Aden Agreste at [facility]. Notified her of potential discharge tomorrow. They do not need to assess patient prior to return. MD will consult PT. [facility] has in-house PT that she can use if needed upon return.  Expected Discharge Plan and Services                                               Social Determinants of Health (SDOH) Interventions SDOH Screenings   Food Insecurity: Patient Unable To Answer (08/22/2023)  Housing: Patient Unable To Answer (08/22/2023)  Transportation Needs: Patient Unable To Answer (08/22/2023)  Utilities: Patient Unable To Answer (08/22/2023)  Financial Resource Strain: Low Risk  (05/20/2023)   Received from [facility]  Social Connections: Patient Unable To Answer (08/22/2023)  Tobacco Use: Low Risk  (08/25/2023)  Recent Concern: Tobacco Use - Medium Risk (07/26/2023)   Received from [facility]    Readmission Risk Interventions     No data to display

## 2023-08-26 ENCOUNTER — Encounter: Payer: Self-pay | Admitting: Gastroenterology

## 2023-08-26 DIAGNOSIS — F0390 Unspecified dementia without behavioral disturbance: Secondary | ICD-10-CM | POA: Diagnosis not present

## 2023-08-26 DIAGNOSIS — I10 Essential (primary) hypertension: Secondary | ICD-10-CM | POA: Diagnosis not present

## 2023-08-26 DIAGNOSIS — K922 Gastrointestinal hemorrhage, unspecified: Secondary | ICD-10-CM | POA: Diagnosis not present

## 2023-08-26 DIAGNOSIS — D62 Acute posthemorrhagic anemia: Secondary | ICD-10-CM | POA: Diagnosis not present

## 2023-08-26 LAB — SURGICAL PATHOLOGY

## 2023-08-26 LAB — PREPARE RBC (CROSSMATCH)

## 2023-08-26 NOTE — Plan of Care (Signed)

## 2023-08-26 NOTE — TOC Transition Note (Addendum)
 Transition of Care [facility]) - Discharge Note   Patient Details  Name: Helen Herring MRN: [id number] Date of Birth: 01/23/1941  Transition of Care [facility]) CM/SW Contact:  Loman Risk, RN Phone Number: 08/26/2023, 2:54 PM   Clinical Narrative:    Patient will DC to: [facility] memory Care Anticipated DC date: 08/26/23  Family notified: daughter  Transport [id number] to transport  Per MD patient ready for DC to . RN, , patient's family, and facility notified of DC. Discharge Summary and fl2  sent to Rod at  facility. RN given number for report. TOC signing off.   Referral for RW made to Jon with [employer].  Daughter to pick up directly from [facility] medical supply        Patient Goals and CMS Choice            Discharge Placement                       Discharge Plan and Services Additional resources added to the After Visit Summary for                                       Social Drivers of Health (SDOH) Interventions SDOH Screenings   Food Insecurity: Patient Unable To Answer (08/22/2023)  Housing: Patient Unable To Answer (08/22/2023)  Transportation Needs: Patient Unable To Answer (08/22/2023)  Utilities: Patient Unable To Answer (08/22/2023)  Financial Resource Strain: Low Risk  (05/20/2023)   Received from [facility]  Social Connections: Patient Unable To Answer (08/22/2023)  Tobacco Use: Low Risk  (08/25/2023)  Recent Concern: Tobacco Use - Medium Risk (07/26/2023)   Received from [facility]     Readmission Risk Interventions     No data to display

## 2023-08-26 NOTE — Evaluation (Addendum)
 Physical Therapy Evaluation Patient Details Name: Helen Herring MRN: [id number] DOB: 26-Jul-1940 Today's Date: 08/26/2023  History of Present Illness  Pt is a 83 y.o. female with medical history significant of dementia, hypertension, CKD stage IIIa, and B12 deficiency who presented to the emergency room from [place] house with dark maroon/burgundy colored stool MD assessment includes GI bleed.  Clinical Impression  Pt was pleasantly confused and became motivated to participate during the session after words of encouragement from PT and put forth good effort throughout. Pt was independent with bed mobility tasks and required CGA for STS and ambulation. A chair follow was utilized for safety during ambulation. Pt remained steady throughout ambulation with RW and no LOBs occurred. Pt reported no adverse symptoms during the session with SpO2 and HR WNL throughout on room air. Pt will benefit from continued PT services upon discharge to safely address deficits listed in patient problem list for decreased caregiver assistance and eventual return to PLOF.          If plan is discharge home, recommend the following: A little help with walking and/or transfers;A lot of help with bathing/dressing/bathroom;Assistance with cooking/housework;Direct supervision/assist for medications management;Assist for transportation;Supervision due to cognitive status;Direct supervision/assist for financial management;Help with stairs or ramp for entrance   Can travel by private vehicle        Equipment Recommendations None recommended by PT  Recommendations for Other Services       Functional Status Assessment Patient has had a recent decline in their functional status and demonstrates the ability to make significant improvements in function in a reasonable and predictable amount of time.     Precautions / Restrictions Precautions Precautions: Fall Recall of Precautions/Restrictions:  Impaired Restrictions Weight Bearing Restrictions Per Provider Order: No      Mobility  Bed Mobility Overal bed mobility: Independent             General bed mobility comments: Pt able to come to sitting EOB from supine without physical assistance or use of bedrails.    Transfers Overall transfer level: Needs assistance Equipment used: Rolling walker (2 wheels) Transfers: Sit to/from Stand Sit to Stand: Contact guard assist           General transfer comment: Pt completed STS from EOB with RW with CGA. VC's required for proper hand placement, but no unsteadiness noted    Ambulation/Gait Ambulation/Gait assistance: Contact guard assist, +2 for safety/equipment Gait Distance (Feet): 30 Feet x 1, 60 feet x 1 Assistive device: Rolling walker (2 wheels) Gait Pattern/deviations: Decreased step length - right, Decreased step length - left, Decreased stride length, Step-through pattern, Trunk flexed Gait velocity: decreased     General Gait Details: Pt demonstrated decreased step length bilaterally and flexed trunk with use of RW, but remained steady throughout and no LOBs observed. A chair follow was utilized for pt safety.   Stairs            Wheelchair Mobility     Tilt Bed    Modified Rankin (Stroke Patients Only)       Balance Overall balance assessment: Needs assistance Sitting-balance support: Feet supported, No upper extremity supported Sitting balance-Leahy Scale: Good Sitting balance - Comments: Pt able to maintain balance sitting EOB without physical assistance or use of bedrails   Standing balance support: Bilateral upper extremity supported, During functional activity, Reliant on assistive device for balance Standing balance-Leahy Scale: Poor Standing balance comment: Pt heavily reliant on RW to maintain balance during static stance and  ambulation, but no LOBs occured                             Pertinent Vitals/Pain Pain  Assessment Pain Assessment: No/denies pain    Home Living Family/patient expects to be discharged to:: Assisted living                 Home Equipment: Rolling Walker (2 wheels);Cane - quad;Cane - single point      Prior Function Prior Level of Function : Needs assist  Cognitive Assist : Mobility (cognitive);ADLs (cognitive) Mobility (Cognitive): Intermittent cues ADLs (Cognitive): Intermittent cues       Mobility Comments: Pt received assistance from staff at assisted living facility for mobility. Pt reported not using AD for mobility, but pt is questionable historian due to cognitive status. ADLs Comments: Pt received assistance from staff at assisted living facility for ADLs. Pt reported being independent with ADLs, but pt is questionable historian due to cognitive status.     Extremity/Trunk Assessment   Upper Extremity Assessment Upper Extremity Assessment: Overall WFL for tasks assessed    Lower Extremity Assessment Lower Extremity Assessment: Overall WFL for tasks assessed       Communication   Communication Communication: No apparent difficulties    Cognition Arousal: Alert Behavior During Therapy: WFL for tasks assessed/performed   PT - Cognitive impairments: History of cognitive impairments, Memory                         Following commands: Impaired Following commands impaired: Follows one step commands inconsistently     Cueing Cueing Techniques: Verbal cues, Gestural cues     General Comments      Exercises     Assessment/Plan    PT Assessment Patient needs continued PT services  PT Problem List Decreased strength;Decreased mobility;Decreased safety awareness;Decreased range of motion;Decreased knowledge of precautions;Decreased activity tolerance;Decreased cognition;Decreased balance;Decreased knowledge of use of DME       PT Treatment Interventions DME instruction;Therapeutic exercise;Gait training;Balance training;Functional  mobility training;Therapeutic activities;Patient/family education    PT Goals (Current goals can be found in the Care Plan section)  Acute Rehab PT Goals PT Goal Formulation: Patient unable to participate in goal setting    Frequency Min 2X/week     Co-evaluation               AM-PAC PT 6 Clicks Mobility  Outcome Measure Help needed turning from your back to your side while in a flat bed without using bedrails?: None Help needed moving from lying on your back to sitting on the side of a flat bed without using bedrails?: None Help needed moving to and from a bed to a chair (including a wheelchair)?: A Little Help needed standing up from a chair using your arms (e.g., wheelchair or bedside chair)?: A Little Help needed to walk in hospital room?: A Little Help needed climbing 3-5 steps with a railing? : A Lot 6 Click Score: 19    End of Session Equipment Utilized During Treatment: Gait belt Activity Tolerance: Patient limited by fatigue Patient left: in chair;with chair alarm set;with call bell/phone within reach Nurse Communication: Mobility status PT Visit Diagnosis: Unsteadiness on feet (R26.81);Other abnormalities of gait and mobility (R26.89);Difficulty in walking, not elsewhere classified (R26.2)    Time: 9147-8295 PT Time Calculation (min) (ACUTE ONLY): 22 min   Charges:  Rhoda Centers, SPT 08/26/23, 1:01 PM This entire session was performed under direct supervision and direction of a licensed therapist/therapist assistant. I have personally read, edited and approve of the note as written.  Lonn Roads, DPT

## 2023-08-26 NOTE — [place] FL2 (Signed)
 [place]  MEDICAID FL2 LEVEL OF CARE FORM     IDENTIFICATION  Patient Name: Helen Herring Birthdate: [birth year] Sex: female Admission Date (Current Location): 08/21/2023  [facility] and [place] Number:  Chiropodist and Address:         Provider Number: [id number]  Attending Physician Name and Address:  Melvinia Stager, MD  Relative Name and Phone Number:       Current Level of Care: Hospital Recommended Level of Care: Assisted Living Facility Prior Approval Number:    Date Approved/Denied:   PASRR Number:    Discharge Plan: Other (Comment) (ALF)    Current Diagnoses: Patient Active Problem List   Diagnosis Date Noted   Polyp of ascending colon 08/25/2023   Lower GI bleed 08/25/2023   Blood in stool 08/23/2023   GI bleed 08/21/2023   Acute GI bleeding 08/21/2023   Acute on chronic blood loss anemia 08/21/2023   Dementia without behavioral disturbance (HCC) 08/21/2023   AKI (acute kidney injury) (HCC) 01/03/2020   Generalized weakness 01/03/2020   Headache 01/03/2020   Peripheral neuropathy 01/03/2020   Acute hyponatremia 07/12/2019   Primary hypertension 07/12/2019   Asthma, chronic, unspecified asthma severity, with acute exacerbation 07/12/2019    Orientation RESPIRATION BLADDER Height & Weight     Self, Place  Normal Incontinent Weight: 59.9 kg Height:  5' 3 (160 cm)  BEHAVIORAL SYMPTOMS/MOOD NEUROLOGICAL BOWEL NUTRITION STATUS      Incontinent Diet (low sodium heart healthy)  AMBULATORY STATUS COMMUNICATION OF NEEDS Skin   Limited Assist Verbally Normal                       Personal Care Assistance Level of Assistance              Functional Limitations Info  Sight, Hearing Sight Info: Impaired Hearing Info: Impaired      SPECIAL CARE FACTORS FREQUENCY  PT (By licensed PT)                    Contractures Contractures Info: Not present    Additional Factors Info  Code Status, Allergies Code Status Info:  Full Allergies Info: NKDA           PAUSE taking these medications     benazepril  20 MG tablet Wait to take this until your doctor or other care provider tells you to start again. Commonly known as: LOTENSIN  Hold until follow up with kidney doctor.           STOP taking these medications     gabapentin  600 MG tablet Commonly known as: NEURONTIN            TAKE these medications     amLODipine 5 MG tablet Commonly known as: NORVASC Take 5 mg by mouth daily.    aspirin 81 MG chewable tablet Chew 81 mg by mouth daily.    brimonidine 0.2 % ophthalmic solution Commonly known as: ALPHAGAN Place 1 drop into both eyes 2 (two) times daily.    donepezil 10 MG tablet Commonly known as: ARICEPT Take 10 mg by mouth daily.    dorzolamide -timolol  2-0.5 % ophthalmic solution Commonly known as: COSOPT  Place 1 drop into both eyes in the morning and at bedtime.    memantine 10 MG tablet Commonly known as: NAMENDA Take 10 mg by mouth 2 (two) times daily.    mirtazapine 15 MG tablet Commonly known as: REMERON Take 15 mg by mouth at bedtime.  travoprost  (benzalkonium) 0.004 % ophthalmic solution Commonly known as: TRAVATAN  Place 1 drop into both eyes at bedtime.       Relevant Imaging Results:  Relevant Lab Results:   Additional Information    Loman Risk, RN

## 2023-08-26 NOTE — Discharge Summary (Addendum)
 Physician Discharge Summary   Patient: Helen Herring MRN: [id number] DOB: 1940-12-16  Admit date:     08/21/2023  Discharge date: 08/26/23  Discharge Physician: Melvinia Stager   PCP: Little Riff, MD   Recommendations at discharge:    F/u PCP in 1-2 weeks   Discharge Diagnoses: Principal Problem:   GI bleed Active Problems:   Primary hypertension   Acute GI bleeding   Acute on chronic blood loss anemia   Dementia without behavioral disturbance (HCC)   Blood in stool   Polyp of ascending colon   Lower GI bleed  Helen Herring is a 83 y.o. female with medical history significant of dementia, hypertension, CKD stage IIIa, B12 deficiency comes in the emergency room from [facility] with dark maroon/burgundy colored stool.   G.I. bleed--likely diverticular acute on chronic anemia -- patient came in with maroon stools. No vomiting. Does not take any blood thinners or NSAID -- IV Protonix BID--EGD negative--d/c PPI -- G.I. consultation with Dr. Baldomero Bone. Plans for EGD. -- Monitor H&H and transfuse as needed -- hemoglobin 10.3 (was 11.3 few months ago) -- patient denies any pain and is asymptomatic. -- CT abdomen shows diverticular disease and significant atherosclerosis of intra-abdominal vessels -- transfuse as needed spoke with patient's daughter and POA Helen Herring --EGD--negative --hbg went down to 7.5-- 1 unit BT--8.3--8.0-- 5.3-- two unit blood transfusion--9.8 -- patient did have some mixed blood with stool on colon prep. Required to unit blood transfusion. --Colonoscopy  with a small polyp not bleeding. Colon filled with diverticulosis and its the likely cause of the bleeding. If rebleeds then red tagged scan and possible angio. Nothing further to do from a GI point of view.  -- Start PO diet --Seen by PT   CKD stage IIIa -- creatinine stable at 0.9   Dementia-- moderate without behavioral disturbance -- will resume meds once GI w/u completed -- patient follows with  neurology Dr. Walden Guise as outpatient   Hypertension -- will resume amlodipine. Hold benazepril  for now till BP stable and defer to PCP      Procedures: EGD/colonoscopy Family communication : [name] on the phone      Consultants: GI Disposition: Assisted living Diet recommendation:  Discharge Diet Orders (From admission, onward)     Start     Ordered   08/26/23 0000  Diet - low sodium heart healthy        08/26/23 1427           Cardiac diet DISCHARGE MEDICATION: Allergies as of 08/26/2023   No Known Allergies      Medication List     PAUSE taking these medications    benazepril  20 MG tablet Wait to take this until your doctor or other care provider tells you to start again. Commonly known as: LOTENSIN  Hold until follow up with kidney doctor.       STOP taking these medications    gabapentin  600 MG tablet Commonly known as: NEURONTIN        TAKE these medications    amLODipine 5 MG tablet Commonly known as: NORVASC Take 5 mg by mouth daily.   aspirin 81 MG chewable tablet Chew 81 mg by mouth daily.   brimonidine 0.2 % ophthalmic solution Commonly known as: ALPHAGAN Place 1 drop into both eyes 2 (two) times daily.   donepezil 10 MG tablet Commonly known as: ARICEPT Take 10 mg by mouth daily.   dorzolamide -timolol  2-0.5 % ophthalmic solution Commonly known as: COSOPT  Place 1  drop into both eyes in the morning and at bedtime.   memantine 10 MG tablet Commonly known as: NAMENDA Take 10 mg by mouth 2 (two) times daily.   mirtazapine 15 MG tablet Commonly known as: REMERON Take 15 mg by mouth at bedtime.   travoprost  (benzalkonium) 0.004 % ophthalmic solution Commonly known as: TRAVATAN  Place 1 drop into both eyes at bedtime.        Discharge Exam: Filed Weights   08/21/23 1026  Weight: 59.9 kg   GENERAL:  83 y.o.-year-old patient with no acute distress.  LUNGS: Normal breath sounds bilaterally, no  wheezing CARDIOVASCULAR: S1, S2 normal. No murmur   ABDOMEN: Soft, nontender, nondistended. EXTREMITIES: No  edema b/l.    NEUROLOGIC: nonfocal  patient is alert and awake, dementia at baseline  Condition at discharge: fair  The results of significant diagnostics from this hospitalization (including imaging, microbiology, ancillary and laboratory) are listed below for reference.   Imaging Studies: CT Angio Abd/Pel W and/or Wo Contrast Result Date: 08/21/2023 EXAM: CTA ABDOMEN AND PELVIS WITH AND WITHOUT CONTRAST 08/21/2023 12:33:01 PM TECHNIQUE: CTA images of the abdomen and pelvis with and without intravenous contrast. Three-dimensional MIP/volume rendered formations were performed. Automated exposure control, iterative reconstruction, and/or weight based adjustment of the mA/kV was utilized to reduce the radiation dose to as low as reasonably achievable. COMPARISON: None available. CLINICAL HISTORY: FINDINGS: VASCULATURE: Extensive atherosclerotic calcifications are present in the abdominal aorta and branch vessels without aneurysm. High-grade stenosis is present on the left. Moderate stenosis is present in the right internal iliac artery. Atherosclerotic calcifications are present in the femoral arteries bilaterally without focal stenosis. GI BLEED: No active extravasation of contrast within the GI tract. AORTA: No acute finding. No abdominal aortic aneurysm. No dissection. CELIAC TRUNK: High-grade stenosis at the origin of the celiac artery. SUPERIOR MESEMTRIC ARTERY: Atherosclerotic changes are present in the proximal SMA without focal stenosis. INFERIOR MESENTERIC ARTERY: Atherosclerotic calcifications are present at the IMA origin without significant stenosis. RENAL ARTERIES: Atherosclerotic calcifications are present at the renal artery origins bilaterally. High-grade stenosis is present on the left. ILIAC ARTERIES: Moderate stenosis is present in the right internal iliac artery. Atherosclerotic  calcifications are present in the femoral arteries bilaterally without focal stenosis. ABDOMEN/PELVIS: LOWER CHEST: Bilateral bronchiectasis is noted. Peripheral honeycombing is present at the lung bases, right greater than left. LIVER: Cholecystectomy is noted. GALLBLADDER AND BILE DUCTS: Cholecystectomy is noted. SPLEEN: The spleen is unremarkable. PANCREAS: The pancreas is unremarkable. ADRENAL GLANDS: Bilateral adrenal glands demonstrate no acute abnormality. KIDNEYS, URETERS AND BLADDER: A 4.5 cm simple cyst is present at the upper pole of the left kidney. A 2.0 cm simple cyst is present at the lower pole of the right kidney. No stones in the kidneys or ureters. No hydronephrosis. No perinephric or periureteral stranding. Urinary bladder is unremarkable. GI AND BOWEL: A small hiatal hernia is present. Extensive diverticular changes are present throughout the colon. No focal inflammatory changes are present to suggest diverticulitis. Stomach and duodenal sweep demonstrate no acute abnormality. There is no bowel obstruction. No abnormal bowel wall thickening or distension. No acute appendicitis. REPRODUCTIVE: Reproductive organs are unremarkable. PERITONEUM AND RETROPERITONEUM: No ascites or free air. LYMPH NODES: No lymphadenopathy. BONES AND SOFT TISSUES: Remote compression fractures. No acute soft tissue abnormality. IMPRESSION: 1. No active GI bleeding. 2. Extensive atherosclerotic calcifications in the abdominal aorta and branch vessels without aneurysm. High-grade stenosis at the origin of the celiac artery and in the left renal artery. Moderate stenosis  in the right internal iliac artery. 3. Extensive diverticular changes throughout the colon without evidence of diverticulitis. Electronically signed by: Audree Leas MD 08/21/2023 01:02 PM EDT RP Workstation: [id number]    Microbiology: Results for orders placed or performed during the hospital encounter of 02/03/22  Urine Culture     Status:  Abnormal   Collection Time: 02/02/22  4:43 PM   Specimen: Urine, Clean Catch  Result Value Ref Range Status   Specimen Description   Final    URINE, CLEAN CATCH Performed at [facility], [address] [place] [place], [place] [address]    Special Requests   Final    NONE Performed at [facility], [address] [place], [place] [address]    Culture MULTIPLE SPECIES PRESENT, SUGGEST RECOLLECTION (A)  Final   Report Status 02/04/2022 FINAL  Final    Labs: CBC: Recent Labs  Lab 08/21/23 1534 08/21/23 2123 08/22/23 0254 08/22/23 1301 08/24/23 0901 08/24/23 2119  WBC 8.5 13.2* 9.1 18.3* 12.2*  --   HGB 9.6* 7.5* 8.3* 8.0* 5.3* 9.8*  HCT 29.5* 22.9* 24.8* 23.9* 16.0*  --   MCV 97.7 98.7 96.9 97.2 98.2  --   PLT 285 250 175 216 172  --    Basic Metabolic Panel: Recent Labs  Lab 08/21/23 1048 08/22/23 1301  NA 138 138  K 4.1 4.1  CL 106 110  CO2 25 20*  GLUCOSE 122* 104*  BUN 26* 29*  CREATININE 0.90 0.74  CALCIUM 8.8* 7.9*   Liver Function Tests: Recent Labs  Lab 08/21/23 1048  AST 15  ALT 9  ALKPHOS 62  BILITOT 0.6  PROT 6.7  ALBUMIN 3.2*   CBG: Recent Labs  Lab 08/21/23 2133  GLUCAP 117*    Discharge time spent: greater than 30 minutes.  Signed: Melvinia Stager, MD [facility] 08/26/2023

## 2023-08-31 NOTE — Op Note (Addendum)
 [facility] Gastroenterology Patient Name: Helen Herring Procedure Date: 08/22/2023 9:30 AM MRN: [id number] Account #: [id number] Date of Birth: 1940/06/28 Admit Type: Inpatient Age: 83 Room: [facility] ENDO ROOM 4 Gender: Female Note Status: Supervisor Override Instrument Name: Barnie Endoscope [id number] Procedure:             Upper GI endoscopy Indications:           Unexplained iron  deficiency anemia, Hematochezia Providers:             Corinn Jess Brooklyn MD, MD Medicines:             General Anesthesia Complications:         No immediate complications. Estimated blood loss: None. Procedure:             Pre-Anesthesia Assessment:                        - Prior to the procedure, a History and Physical was                         performed, and patient medications and allergies were                         reviewed. The patient is unable to give consent                         secondary to the patient being legally incompetent to                         consent. The risks and benefits of the procedure and                         the sedation options and risks were discussed with the                         patient's daughter. All questions were answered and                         informed consent was obtained. Patient identification                         and proposed procedure were verified by the physician,                         the nurse, the anesthesiologist, the anesthetist and                         the technician in the pre-procedure area in the                         procedure room in the endoscopy suite. Mental Status                         Examination: alert and oriented. Airway Examination:                         normal oropharyngeal airway and neck mobility.  Respiratory Examination: clear to auscultation. CV                         Examination: normal. Prophylactic Antibiotics: The                         patient does not  require prophylactic antibiotics.                         Prior Anticoagulants: The patient has taken no                         anticoagulant or antiplatelet agents. ASA Grade                         Assessment: III - A patient with severe systemic                         disease. After reviewing the risks and benefits, the                         patient was deemed in satisfactory condition to                         undergo the procedure. The anesthesia plan was to use                         general anesthesia. Immediately prior to                         administration of medications, the patient was                         re-assessed for adequacy to receive sedatives. The                         heart rate, respiratory rate, oxygen saturations,                         blood pressure, adequacy of pulmonary ventilation, and                         response to care were monitored throughout the                         procedure. The physical status of the patient was                         re-assessed after the procedure.                        After obtaining informed consent, the endoscope was                         passed under direct vision. Throughout the procedure,                         the patient's blood pressure, pulse, and oxygen  saturations were monitored continuously. The                         Endosonoscope was introduced through the mouth, and                         advanced to the second part of duodenum. The upper GI                         endoscopy was accomplished without difficulty. The                         patient tolerated the procedure well. Findings:      The esophagus was normal.      The stomach was normal.      The examined duodenum was normal. Impression:            - Normal esophagus.                        - Normal stomach.                        - Normal examined duodenum.                        - No specimens  collected. Recommendation:        - Return patient to hospital ward for ongoing care.                        - Clear liquid diet today.                        - Perform a colonoscopy tomorrow. Procedure Code(s):     --- Professional ---                        [phone], Esophagogastroduodenoscopy, flexible,                         transoral; diagnostic, including collection of                         specimen(s) by brushing or washing, when performed                         (separate procedure) Diagnosis Code(s):     --- Professional ---                        D50.9, Iron  deficiency anemia, unspecified                        K92.1, Melena (includes Hematochezia) CPT copyright 2022 [employer]. All rights reserved. The codes documented in this report are preliminary and upon coder review may  be revised to meet current compliance requirements. Dr. Corinn Brooklyn Corinn Jess Brooklyn MD, MD 08/31/2023 12:48:55 PM This report has been signed electronically. Number of Addenda: 0 Note Initiated On: 08/22/2023 9:30 AM Estimated Blood Loss:  Estimated blood loss: none.      [facility]

## 2023-09-06 ENCOUNTER — Encounter: Payer: Self-pay | Admitting: Gastroenterology

## 2023-10-21 ENCOUNTER — Encounter: Payer: Self-pay | Admitting: Cardiology

## 2023-10-21 ENCOUNTER — Ambulatory Visit: Attending: Cardiology | Admitting: Cardiology

## 2023-10-21 VITALS — BP 131/80 | HR 75 | Ht 63.0 in | Wt 118.2 lb

## 2023-10-21 DIAGNOSIS — I10 Essential (primary) hypertension: Secondary | ICD-10-CM | POA: Diagnosis present

## 2023-10-21 DIAGNOSIS — I700 Atherosclerosis of aorta: Secondary | ICD-10-CM | POA: Diagnosis present

## 2023-10-21 DIAGNOSIS — I2510 Atherosclerotic heart disease of native coronary artery without angina pectoris: Secondary | ICD-10-CM | POA: Diagnosis present

## 2023-10-21 MED ORDER — ASPIRIN 81 MG PO TBEC: 81.0000 mg | DELAYED_RELEASE_TABLET | Freq: Every day | ORAL | Status: DC

## 2023-10-21 NOTE — Progress Notes (Signed)
 Cardiology Office Note:    Date:  10/21/2023   ID:  Helen, Herring 07-17-1940, MRN [id number]  PCP:  [place]   [facility] Providers Cardiologist:  None     Referring MD: Rudolpho Norleen BIRCH, MD   Chief Complaint  Patient presents with   Extensive atherosclerotic calcifications in the abdominal ao    Patient is doing well on today. Meds reviewed.     History of Present Illness:    Helen Herring is a 83 y.o. female with a hx of hypertension, dementia who presents due to aortic atherosclerosis.  Patient had an abdominal CT dated 08/2023 showing extensive abdominal aortic atherosclerosis, high-grade stenosis present in the left internal iliac artery.  Patient denies chest pain or leg pain.  Compliant with medications as prescribed.  Previously developed some swelling in her legs, resolved since starting chlorthalidone.  Past Medical History:  Diagnosis Date   Asthma    Hypertension     Past Surgical History:  Procedure Laterality Date   ANTERIOR VITRECTOMY Right 01/20/2018   Procedure: ANTERIOR VITRECTOMY;  Surgeon: Ferol Rogue, MD;  Location: [facility] ORS;  Service: Ophthalmology;  Laterality: Right;   CATARACT EXTRACTION W/PHACO Right 01/20/2018   Procedure: CATARACT EXTRACTION PHACO AND INTRAOCULAR LENS PLACEMENT (IOC);  Surgeon: Ferol Rogue, MD;  Location: [facility] ORS;  Service: Ophthalmology;  Laterality: Right;  Lot# [id number] H US : 01:49.4 CDE: 21.64   CHOLECYSTECTOMY     COLONOSCOPY N/A 08/25/2023   Procedure: COLONOSCOPY;  Surgeon: Jinny Carmine, MD;  Location: [facility] ENDOSCOPY;  Service: Endoscopy;  Laterality: N/A;   ESOPHAGOGASTRODUODENOSCOPY N/A 08/22/2023   Procedure: EGD (ESOPHAGOGASTRODUODENOSCOPY);  Surgeon: Unk Corinn Skiff, MD;  Location[facility] ENDOSCOPY;  Service: Gastroenterology;  Laterality: N/A;   POLYPECTOMY  08/25/2023   Procedure: POLYPECTOMY, INTESTINE;  Surgeon: Jinny Carmine, MD;  Location: [facility] ENDOSCOPY;  Service:  Endoscopy;;    Current Medications: Current Meds  Medication Sig   amLODipine  (NORVASC ) 5 MG tablet Take 5 mg by mouth daily.   aspirin  EC 81 MG tablet Take 1 tablet (81 mg total) by mouth daily. Swallow whole.   brimonidine  (ALPHAGAN ) 0.2 % ophthalmic solution Place 1 drop into both eyes 2 (two) times daily.   chlorthalidone (HYGROTON) 25 MG tablet Take 25 mg by mouth.   donepezil  (ARICEPT ) 10 MG tablet Take 10 mg by mouth daily.   dorzolamide -timolol  (COSOPT ) 22.3-6.8 MG/ML ophthalmic solution Place 1 drop into both eyes in the morning and at bedtime.   memantine  (NAMENDA ) 10 MG tablet Take 10 mg by mouth 2 (two) times daily.   mirtazapine  (REMERON ) 15 MG tablet Take 15 mg by mouth at bedtime.   travoprost , benzalkonium, (TRAVATAN ) 0.004 % ophthalmic solution Place 1 drop into both eyes at bedtime.     Allergies:   Patient has no known allergies.   Social History   Socioeconomic History   Marital status: Widowed    Spouse name: Not on file   Number of children: Not on file   Years of education: Not on file   Highest education level: Not on file  Occupational History   Not on file  Tobacco Use   Smoking status: Never   Smokeless tobacco: Never  Vaping Use   Vaping status: Never Used  Substance and Sexual Activity   Alcohol use: Never   Drug use: Never   Sexual activity: Not on file  Other Topics Concern   Not on file  Social History Narrative   Not on file  Social Drivers of Corporate investment banker Strain: Low Risk  (05/20/2023)   Received from [facility]   Overall Financial Resource Strain (CARDIA)    Difficulty of Paying Living Expenses: Not hard at all  Food Insecurity: Patient Unable To Answer (08/22/2023)   Hunger Vital Sign    Worried About Running Out of Food in the Last Year: Patient unable to answer    Ran Out of Food in the Last Year: Patient unable to answer  Transportation Needs: Patient Unable To Answer (08/22/2023)   PRAPARE -  Transportation    Lack of Transportation (Medical): Patient unable to answer    Lack of Transportation (Non-Medical): Patient unable to answer  Physical Activity: Not on file  Stress: Not on file  Social Connections: Patient Unable To Answer (08/22/2023)   Social Connection and Isolation Panel    Frequency of Communication with Friends and Family: Patient unable to answer    Frequency of Social Gatherings with Friends and Family: Patient unable to answer    Attends Religious Services: Patient unable to answer    Active Member of Clubs or Organizations: Patient unable to answer    Attends Banker Meetings: Patient unable to answer    Marital Status: Patient unable to answer     Family History: The patient's family history is not on file.  ROS:   Please see the history of present illness.     All other systems reviewed and are negative.  EKGs/Labs/Other Studies Reviewed:    The following studies were reviewed today:  EKG Interpretation Date/Time:  Thursday October 21 2023 10:17:47 EDT Ventricular Rate:  75 PR Interval:  260 QRS Duration:  62 QT Interval:  430 QTC Calculation: 480 R Axis:   37  Text Interpretation: Sinus rhythm with 1st degree A-V block Septal infarct , age undetermined Confirmed by Darliss Rogue ([id number]) on 10/21/2023 10:23:08 AM    Recent Labs: 08/21/2023: ALT 9 08/22/2023: BUN 29; Creatinine, Ser 0.74; Potassium 4.1; Sodium 138 08/24/2023: Hemoglobin 9.8; Platelets 172  Recent Lipid Panel No results found for: CHOL, TRIG, HDL, CHOLHDL, VLDL, LDLCALC, LDLDIRECT  Lipid panel 01/14/2023 total cholesterol 167, triglycerides 73, HDL 60, LDL 93  Risk Assessment/Calculations:               Physical Exam:    VS:  BP 131/80   Pulse 75   Ht 5' 3 (1.6 m)   Wt 118 lb 3.2 oz (53.6 kg)   LMP  (LMP Unknown)   SpO2 98%   BMI 20.94 kg/m     Wt Readings from Last 3 Encounters:  10/21/23 118 lb 3.2 oz (53.6 kg)  08/21/23 132 lb  0.9 oz (59.9 kg)  02/02/22 132 lb (59.9 kg)     GEN:  Well nourished, well developed in no acute distress, soft-spoken HEENT: Normal NECK: No JVD; No carotid bruits CARDIAC: RRR, no murmurs, rubs, gallops RESPIRATORY:  Clear to auscultation without rales, wheezing or rhonchi  ABDOMEN: Soft, non-tender, non-distended MUSCULOSKELETAL:  No edema; No deformity  SKIN: Warm and dry NEUROLOGIC:  Alert and oriented x 3 PSYCHIATRIC:  Normal affect   ASSESSMENT:    1. Atherosclerosis of abdominal aorta (HCC)   2. Primary hypertension   3. ASCVD (arteriosclerotic cardiovascular disease)    PLAN:    In order of problems listed above:  Abdominal aortic atherosclerosis, significant left iliac artery stenosis.  continue aspirin  81 mg daily.  Cholesterol controlled.  Given dementia, advanced age, unsure  of benefit of statin.  Denies claudication.  Continue medical management. Hypertension, BP controlled.  Continue Norvasc  5 mg daily, chlorthalidone 25 mg daily. ASCVD risk, abdominal calcifications, aspirin  as above.  Denies chest pain.  Obtain echocardiogram.  Follow-up after echo     Medication Adjustments/Labs and Tests Ordered: Current medicines are reviewed at length with the patient today.  Concerns regarding medicines are outlined above.  Orders Placed This Encounter  Procedures   EKG 12-Lead   ECHOCARDIOGRAM COMPLETE   Meds ordered this encounter  Medications   aspirin  EC 81 MG tablet    Sig: Take 1 tablet (81 mg total) by mouth daily. Swallow whole.    Patient Instructions  Medication Instructions:  - START aspirin  81 mg daily   *If you need a refill on your cardiac medications before your next appointment, please call your pharmacy*  Lab Work: No labs ordered today  If you have labs (blood work) drawn today and your tests are completely normal, you will receive your results only by: MyChart Message (if you have MyChart) OR A paper copy in the mail If you have any lab  test that is abnormal or we need to change your treatment, we will call you to review the results.  Testing/Procedures: Your physician has requested that you have an echocardiogram. Echocardiography is a painless test that uses sound waves to create images of your heart. It provides your doctor with information about the size and shape of your heart and how well your heart's chambers and valves are working.   You may receive an ultrasound enhancing agent through an IV if needed to better visualize your heart during the echo. This procedure takes approximately one hour.  There are no restrictions for this procedure.  This will take place at [address] [facility] [address]) [address], [place] [address]  Please note: We ask at that you not bring children with you during ultrasound (echo/ vascular) testing. Due to room size and safety concerns, children are not allowed in the ultrasound rooms during exams. Our front office staff cannot provide observation of children in our lobby area while testing is being conducted. An adult accompanying a patient to their appointment will only be allowed in the ultrasound room at the discretion of the ultrasound technician under special circumstances. We apologize for any inconvenience.   Follow-Up: At [facility], you and your health needs are our priority.  As part of our continuing mission to provide you with exceptional heart care, our providers are all part of one team.  This team includes your primary Cardiologist (physician) and Advanced Practice Providers or APPs (Physician Assistants and Nurse Practitioners) who all work together to provide you with the care you need, when you need it.  Your next appointment:   3 month(s)  Provider:   You may see Dr. Darliss or one of the following Advanced Practice Providers on your designated Care Team:   Lonni Meager, NP Lesley Maffucci, PA-C Bernardino Bring, PA-C Cadence [place], PA-C Tylene Lunch,  NP Barnie Hila, NP    We recommend signing up for the patient portal called MyChart.  Sign up information is provided on this After Visit Summary.  MyChart is used to connect with patients for Virtual Visits (Telemedicine).  Patients are able to view lab/test results, encounter notes, upcoming appointments, etc.  Non-urgent messages can be sent to your provider as well.   To learn more about what you can do with MyChart, go to [email or web address].  Signed, Redell Cave, MD  10/21/2023 10:48 AM    [facility]

## 2023-10-21 NOTE — Patient Instructions (Signed)
 Medication Instructions:  - START aspirin  81 mg daily   *If you need a refill on your cardiac medications before your next appointment, please call your pharmacy*  Lab Work: No labs ordered today  If you have labs (blood work) drawn today and your tests are completely normal, you will receive your results only by: MyChart Message (if you have MyChart) OR A paper copy in the mail If you have any lab test that is abnormal or we need to change your treatment, we will call you to review the results.  Testing/Procedures: Your physician has requested that you have an echocardiogram. Echocardiography is a painless test that uses sound waves to create images of your heart. It provides your doctor with information about the size and shape of your heart and how well your heart's chambers and valves are working.   You may receive an ultrasound enhancing agent through an IV if needed to better visualize your heart during the echo. This procedure takes approximately one hour.  There are no restrictions for this procedure.  This will take place at [address] [facility] [address]) [address], [place] [address]  Please note: We ask at that you not bring children with you during ultrasound (echo/ vascular) testing. Due to room size and safety concerns, children are not allowed in the ultrasound rooms during exams. Our front office staff cannot provide observation of children in our lobby area while testing is being conducted. An adult accompanying a patient to their appointment will only be allowed in the ultrasound room at the discretion of the ultrasound technician under special circumstances. We apologize for any inconvenience.   Follow-Up: At [facility], you and your health needs are our priority.  As part of our continuing mission to provide you with exceptional heart care, our providers are all part of one team.  This team includes your primary Cardiologist (physician) and Advanced  Practice Providers or APPs (Physician Assistants and Nurse Practitioners) who all work together to provide you with the care you need, when you need it.  Your next appointment:   3 month(s)  Provider:   You may see Dr. Darliss or one of the following Advanced Practice Providers on your designated Care Team:   Lonni Meager, NP Lesley Maffucci, PA-C Bernardino Bring, PA-C Cadence [place], PA-C Tylene Lunch, NP Barnie Hila, NP    We recommend signing up for the patient portal called MyChart.  Sign up information is provided on this After Visit Summary.  MyChart is used to connect with patients for Virtual Visits (Telemedicine).  Patients are able to view lab/test results, encounter notes, upcoming appointments, etc.  Non-urgent messages can be sent to your provider as well.   To learn more about what you can do with MyChart, go to [email or web address].

## 2023-10-26 ENCOUNTER — Ambulatory Visit: Attending: Cardiology

## 2023-10-26 DIAGNOSIS — I2510 Atherosclerotic heart disease of native coronary artery without angina pectoris: Secondary | ICD-10-CM | POA: Diagnosis present

## 2023-10-26 LAB — ECHOCARDIOGRAM COMPLETE
AR max vel: 1.47 cm2
AV Area VTI: 1.48 cm2
AV Area mean vel: 1.47 cm2
AV Mean grad: 4 mmHg
AV Peak grad: 6.7 mmHg
Ao pk vel: 1.29 m/s
Area-P 1/2: 3.93 cm2
MV VTI: 2.21 cm2
Single Plane A4C EF: 64.4 %

## 2023-10-27 ENCOUNTER — Ambulatory Visit: Payer: Self-pay | Admitting: Cardiology

## 2023-11-30 ENCOUNTER — Other Ambulatory Visit: Payer: Self-pay

## 2023-12-01 MED ORDER — ASPIRIN 81 MG PO TBEC: 81.0000 mg | DELAYED_RELEASE_TABLET | Freq: Every day | ORAL | 3 refills | Status: DC

## 2023-12-27 ENCOUNTER — Other Ambulatory Visit: Payer: Self-pay

## 2023-12-27 MED ORDER — ASPIRIN 81 MG PO TBEC: 81.0000 mg | DELAYED_RELEASE_TABLET | Freq: Every day | ORAL | 2 refills | Status: AC

## 2024-01-31 ENCOUNTER — Ambulatory Visit: Attending: Cardiology | Admitting: Cardiology
